# Patient Record
Sex: Female | Born: 1950 | Race: White | Hispanic: No | Marital: Married | Smoking: Former smoker
Health system: Southern US, Community
[De-identification: ages and names within clinical notes are randomized; demographics above are authoritative.]

## PROBLEM LIST (undated history)

## (undated) DIAGNOSIS — C801 Malignant (primary) neoplasm, unspecified: Secondary | ICD-10-CM

## (undated) HISTORY — PX: APPENDECTOMY: SHX54

## (undated) HISTORY — PX: BRAIN SURGERY: SHX531

## (undated) HISTORY — PX: KNEE ARTHROCENTESIS: SUR44

---

## 2017-01-24 LAB — IFOBT (OCCULT BLOOD): IFOBT: NEGATIVE

## 2017-08-07 ENCOUNTER — Emergency Department (HOSPITAL_COMMUNITY): Payer: 59

## 2017-08-07 ENCOUNTER — Encounter (HOSPITAL_COMMUNITY): Payer: Self-pay | Admitting: Emergency Medicine

## 2017-08-07 ENCOUNTER — Other Ambulatory Visit: Payer: Self-pay

## 2017-08-07 ENCOUNTER — Emergency Department (HOSPITAL_COMMUNITY)
Admission: EM | Admit: 2017-08-07 | Discharge: 2017-08-07 | Disposition: A | Discharge status: Home / Self Care | Payer: 59 | Attending: Emergency Medicine | Admitting: Emergency Medicine

## 2017-08-07 DIAGNOSIS — Z041 Encounter for examination and observation following transport accident: Secondary | ICD-10-CM | POA: Diagnosis present

## 2017-08-07 DIAGNOSIS — R0789 Other chest pain: Secondary | ICD-10-CM | POA: Diagnosis not present

## 2017-08-07 DIAGNOSIS — Y999 Unspecified external cause status: Secondary | ICD-10-CM | POA: Insufficient documentation

## 2017-08-07 DIAGNOSIS — Y929 Unspecified place or not applicable: Secondary | ICD-10-CM | POA: Insufficient documentation

## 2017-08-07 DIAGNOSIS — Y939 Activity, unspecified: Secondary | ICD-10-CM | POA: Insufficient documentation

## 2017-08-07 DIAGNOSIS — Z87891 Personal history of nicotine dependence: Secondary | ICD-10-CM | POA: Insufficient documentation

## 2017-08-07 HISTORY — DX: Malignant (primary) neoplasm, unspecified: C80.1
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
## 2017-08-07 MED ORDER — NAPROXEN 500 MG PO TABS
500.0000 mg | ORAL_TABLET | Freq: Once | ORAL | Status: AC
  Administered 2017-08-07: 500 mg via ORAL
  Filled 2017-08-07: qty 1

## 2017-08-07 MED ORDER — NAPROXEN 500 MG PO TABS: 500.0000 mg | ORAL_TABLET | Freq: Two times a day (BID) | ORAL | 0 refills | Status: DC

## 2017-08-07 MED ORDER — METHOCARBAMOL 500 MG PO TABS: 500.0000 mg | ORAL_TABLET | Freq: Two times a day (BID) | ORAL | 0 refills | Status: DC
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
## 2017-08-12 ENCOUNTER — Ambulatory Visit
Admission: RE | Admit: 2017-08-12 | Discharge: 2017-08-12 | Disposition: A | Discharge status: Home / Self Care | Payer: 59 | Source: Ambulatory Visit | Attending: Family Medicine | Admitting: Family Medicine

## 2017-08-12 ENCOUNTER — Other Ambulatory Visit: Payer: Self-pay | Admitting: Family Medicine

## 2017-08-12 DIAGNOSIS — R109 Unspecified abdominal pain: Secondary | ICD-10-CM
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
## 2017-08-12 MED ORDER — IOPAMIDOL (ISOVUE-300) INJECTION 61%
100.0000 mL | Freq: Once | INTRAVENOUS | Status: AC | PRN
  Administered 2017-08-12: 100 mL via INTRAVENOUS

## 2018-02-06 LAB — IFOBT (OCCULT BLOOD): IFOBT: NEGATIVE

## 2018-05-04 ENCOUNTER — Encounter: Payer: Self-pay | Admitting: Family Medicine

## 2018-05-04 ENCOUNTER — Ambulatory Visit (INDEPENDENT_AMBULATORY_CARE_PROVIDER_SITE_OTHER): Payer: 59 | Admitting: Family Medicine

## 2018-05-04 DIAGNOSIS — M159 Polyosteoarthritis, unspecified: Secondary | ICD-10-CM | POA: Insufficient documentation

## 2018-05-04 NOTE — Progress Notes (Signed)
HPI:   Lindsey Kemp is a 67 y.o. female, who is here today to establish care.  Former PCP: Dr Osborne Casco. Last preventive routine visit: 11/2017  Chronic medical problems: OA  She exercises regularly and at least 6 days per week. Follows a "extremitly" healthful diet.  Concerns today: She mentions arthralgias and hands numbness but states that she does not want to address these problems because they are chronic and she is not interested in further work up. States that she had EMG of lower extremities and it was very uncomfortable.  Review of Systems  Musculoskeletal: Positive for arthralgias and joint swelling. Negative for gait problem and neck pain.  Neurological: Positive for numbness. Negative for weakness.    No current outpatient medications on file prior to visit.   No current facility-administered medications on file prior to visit.      Past Medical History:  Diagnosis Date  . Cancer ([facility])    Allergies  Allergen Reactions  . Codeine Nausea And Vomiting    Family History  Problem Relation Age of Onset  . Cancer Mother     Social History   Socioeconomic History  . Marital status: Married    Spouse name: Not on file  . Number of children: Not on file  . Years of education: Not on file  . Highest education level: Not on file  Occupational History  . Not on file  Social Needs  . Financial resource strain: Not on file  . Food insecurity:    Worry: Not on file    Inability: Not on file  . Transportation needs:    Medical: Not on file    Non-medical: Not on file  Tobacco Use  . Smoking status: Former Smoker    Types: Cigarettes  Substance and Sexual Activity  . Alcohol use: Yes    Comment: weekly  . Drug use: No  . Sexual activity: Yes  Lifestyle  . Physical activity:    Days per week: Not on file    Minutes per session: Not on file  . Stress: Not on file  Relationships  . Social connections:    Talks on phone: Not on file    Gets  together: Not on file    Attends religious service: Not on file    Active member of club or organization: Not on file    Attends meetings of clubs or organizations: Not on file    Relationship status: Not on file  Other Topics Concern  . Not on file  Social History Narrative  . Not on file    Vitals:   05/04/18 1414  BP: 110/80  Pulse: 75  Resp: 12  Temp: 98.4 F (36.9 C)  SpO2: 98%    Body mass index is 22.64 kg/m.   Physical Exam  Nursing note and vitals reviewed. Constitutional: She appears well-developed and well-nourished.  HENT:  Head: Normocephalic and atraumatic.  Respiratory: No respiratory distress.  Musculoskeletal:     Comments: Some mild IP deformities noted on inspection. Heberden's node  and Bouchard's nodes. I do not appreciate signs of synovitis.   Psychiatric: Her affect is blunt.  Well groomed,good eye contact.     ASSESSMENT AND PLAN:   Lindsey Kemp was seen today for establish care.  Diagnoses and all orders for this visit:  Generalized osteoarthritis of hand  Educated about Dx and prognosis.   Upset because she was not expecting to have an examination, she is not willing to  pay for the visit because she has had a high deductible. According to patient, she was told on the phone when she arranged appointment that this visit will be a "meetig and greeting."   So no charge for today's visit. She will arrange appt if she wants to address concerns,otherwise she can follow in 11/2018 for her routine CPE.      [name]. [name], MD  [facility]. [place] office.

## 2018-05-05 ENCOUNTER — Encounter: Payer: Self-pay | Admitting: Family Medicine

## 2018-05-06 ENCOUNTER — Other Ambulatory Visit: Payer: Self-pay | Admitting: Obstetrics

## 2018-05-06 DIAGNOSIS — M810 Age-related osteoporosis without current pathological fracture: Secondary | ICD-10-CM

## 2018-05-06 DIAGNOSIS — Z853 Personal history of malignant neoplasm of breast: Secondary | ICD-10-CM

## 2018-05-07 ENCOUNTER — Encounter: Payer: Self-pay | Admitting: Family Medicine

## 2018-05-22 ENCOUNTER — Telehealth: Payer: Self-pay | Admitting: Family Medicine

## 2018-05-22 NOTE — Telephone Encounter (Signed)
Physician Results form to be filled out, placed in dr's folder.  Fax to [phone] upon completion.

## 2018-05-28 ENCOUNTER — Encounter: Payer: Self-pay | Admitting: *Deleted

## 2018-05-28 NOTE — Telephone Encounter (Signed)
Pt came by and signed the form and it was put in the providers folder for completion by the provider and faxed.

## 2018-05-28 NOTE — Telephone Encounter (Signed)
Form placed on Dr. Doug Sou desk for signature.

## 2018-05-28 NOTE — Telephone Encounter (Signed)
Left detailed message for patient to schedule appointment. Patient does not have any labs in chart to have form filled out.

## 2018-05-28 NOTE — Telephone Encounter (Signed)
Spoke with patient and informed her that her records were on PCP's desk and that she needed to sign form. Patient informed that PCP is out of the office until Monday and that once she signs it, form will be faxed. Patient verbalized understanding and stated that she would come in on 05/28/18 to sign her part of the form. Form placed at front desk for patient to sign.

## 2018-05-28 NOTE — Telephone Encounter (Signed)
Pt called and stated that she recently had labs done at another office. Pt states that the office sent over all records and we should have the recent labs. Please advise [phone]

## 2018-05-29 NOTE — Telephone Encounter (Signed)
Form placed on provider's desk for signature.  

## 2018-06-01 NOTE — Telephone Encounter (Signed)
Form faxed as requested with confirmation.

## 2018-06-11 ENCOUNTER — Ambulatory Visit
Admission: RE | Admit: 2018-06-11 | Discharge: 2018-06-11 | Disposition: A | Discharge status: Home / Self Care | Payer: 59 | Source: Ambulatory Visit | Attending: Obstetrics | Admitting: Obstetrics

## 2018-06-11 DIAGNOSIS — Z853 Personal history of malignant neoplasm of breast: Secondary | ICD-10-CM

## 2018-06-11 DIAGNOSIS — M810 Age-related osteoporosis without current pathological fracture: Secondary | ICD-10-CM

## 2018-11-05 ENCOUNTER — Encounter: Payer: Self-pay | Admitting: Family Medicine

## 2018-11-09 ENCOUNTER — Encounter: Payer: Self-pay | Admitting: Family Medicine

## 2018-11-09 ENCOUNTER — Ambulatory Visit (INDEPENDENT_AMBULATORY_CARE_PROVIDER_SITE_OTHER): Payer: 59 | Admitting: Family Medicine

## 2018-11-09 ENCOUNTER — Other Ambulatory Visit: Payer: Self-pay

## 2018-11-09 DIAGNOSIS — M2550 Pain in unspecified joint: Secondary | ICD-10-CM

## 2018-11-09 DIAGNOSIS — R200 Anesthesia of skin: Secondary | ICD-10-CM | POA: Diagnosis not present

## 2018-11-09 DIAGNOSIS — I7300 Raynaud's syndrome without gangrene: Secondary | ICD-10-CM

## 2018-11-09 NOTE — Progress Notes (Signed)
Virtual Visit via Video Note   I connected with Lindsey Ocampo on 11/09/18 at  4:00 PM EDT by a video enabled telemedicine application and verified that I am speaking with the correct person using two identifiers.  Location patient: home Location provider:work office Persons participating in the virtual visit: patient, provider  I discussed the limitations of evaluation and management by telemedicine and the availability of in person appointments. The patient expressed understanding and agreed to proceed.   HPI: Lindsey Kemp is a 68 yo female with Hx of joint pain, who is requesting referral to rheumatologist. She tried to arranged appt with Dr Estanislado Pandy but was told she needed a referral.  She has long Hx of arthralgias, concerned because some symptoms are getting worse. States that her fingers have been "deformed" since she was a teenager. Throbbing ,severe fingers and toes pain.  Having IP joint edema,occasionally she also see erythema. Morning stiffness that lasts > 1 hours. She does not have daily symptoms.  Pain and stiffness are alleviated by exercise. Finger numbness and sometiems fingers turn white + Cold sensation,denies cyanosis.These symptoms seem to be worse with cold weather but also noted when she is warm in bed.  Numbness is worse in the morning, alleviated by shaking hands. Rings do not fit in the morning,edema improves as the day goes.   She has had EMG of LE's,refused upper extremities studies.  Hx of carpal tunnel synd,states that symptoms are not like what she had before. Negative for weakness. Hx of neck pain and stiffness.  She has followed with chiropractor for right hip pain,states that she was told she had "nerve pressure" between L1-2.  ROS: See pertinent positives and negatives per HPI.  Past Medical History:  Diagnosis Date  . Cancer [facility])     Past Surgical History:  Procedure Laterality Date  . APPENDECTOMY    . BRAIN SURGERY    . CESAREAN  SECTION    . KNEE ARTHROCENTESIS     left    Family History  Problem Relation Age of Onset  . Cancer Mother     Social History   Socioeconomic History  . Marital status: Married    Spouse name: Not on file  . Number of children: Not on file  . Years of education: Not on file  . Highest education level: Not on file  Occupational History  . Not on file  Social Needs  . Financial resource strain: Not on file  . Food insecurity    Worry: Not on file    Inability: Not on file  . Transportation needs    Medical: Not on file    Non-medical: Not on file  Tobacco Use  . Smoking status: Former Smoker    Types: Cigarettes  . Smokeless tobacco: Never Used  Substance and Sexual Activity  . Alcohol use: Yes    Comment: weekly  . Drug use: No  . Sexual activity: Yes  Lifestyle  . Physical activity    Days per week: Not on file    Minutes per session: Not on file  . Stress: Not on file  Relationships  . Social Herbalist on phone: Not on file    Gets together: Not on file    Attends religious service: Not on file    Active member of club or organization: Not on file    Attends meetings of clubs or organizations: Not on file    Relationship status: Not on file  . Intimate  partner violence    Fear of current or ex partner: Not on file    Emotionally abused: Not on file    Physically abused: Not on file    Forced sexual activity: Not on file  Other Topics Concern  . Not on file  Social History Narrative  . Not on file     No current outpatient medications on file.  EXAM:  VITALS per patient if applicable:N/A  GENERAL: alert, oriented, appears well and in no acute distress  HEENT: atraumatic, conjunttiva clear, no obvious abnormalities on inspection of external nose and ears  NECK: normal movements of the head and neck  LUNGS: on inspection no signs of respiratory distress, breathing rate appears normal, no obvious gross SOB, gasping or wheezing  CV:  no obvious cyanosis  Lindsey: moves all visible extremities without noticeable edema or erythema.IP hands mild deformities.  PSYCH/NEURO: pleasant and cooperative, no obvious depression,she is anxious, speech and thought processing grossly intact  ASSESSMENT AND PLAN:  Discussed the following assessment and plan:  Orders Placed This Encounter  Procedures  . CBC  . Cyclic citrul peptide antibody, IgG  . C-reactive protein  . Rheumatoid factor  . Sedimentation rate  . ANA  . Ambulatory referral to Rheumatology   Polyarthralgia Possible etiologies discussed. She sent toes picture, 2nd left toe with distal phalange edema and 3rd toe with IP nodule and mild deformity. ? OA,RA. Rheumatology referral placed. Further recommendations will be given according to lab results.   Bilateral hand numbness ? Carpal tunnel synd, raynaud related,radicukar among among some to consider. She is not interested in EMG. Instructed about warning signs.   Raynaud's phenomenon without gangrene  Some of reported symptom suggest Raynaud like symptom. Instructed about warning signs. Follow with rheumatologist.   I discussed the assessment and treatment plan with the patient. The patient was provided an opportunity to ask questions and all were answered. The patient agreed with the plan and demonstrated an understanding of the instructions.    Return if symptoms worsen or fail to improve.    Arneisha Kincannon [name], MD

## 2018-11-09 NOTE — Assessment & Plan Note (Addendum)
Possible etiologies discussed. She sent toes picture, 2nd left toe with distal phalange edema and 3rd toe with IP nodule and mild deformity. ? OA,RA. Rheumatology referral placed. Further recommendations will be given according to lab results.

## 2018-11-09 NOTE — Assessment & Plan Note (Signed)
?   Carpal tunnel synd, raynaud related,radicukar among among some to consider. She is not interested in EMG. Instructed about warning signs.

## 2018-11-10 ENCOUNTER — Other Ambulatory Visit (INDEPENDENT_AMBULATORY_CARE_PROVIDER_SITE_OTHER): Payer: 59

## 2018-11-10 ENCOUNTER — Other Ambulatory Visit: Payer: Self-pay

## 2018-11-10 DIAGNOSIS — I7300 Raynaud's syndrome without gangrene: Secondary | ICD-10-CM | POA: Diagnosis not present

## 2018-11-10 DIAGNOSIS — M2550 Pain in unspecified joint: Secondary | ICD-10-CM

## 2018-11-10 LAB — CBC
HCT: 38.7 % (ref 36.0–46.0)
Hemoglobin: 12.8 g/dL (ref 12.0–15.0)
MCHC: 33 g/dL (ref 30.0–36.0)
MCV: 89.4 fl (ref 78.0–100.0)
Platelets: 263 K/uL (ref 150.0–400.0)
RBC: 4.33 Mil/uL (ref 3.87–5.11)
RDW: 13.9 % (ref 11.5–15.5)
WBC: 5.3 K/uL (ref 4.0–10.5)

## 2018-11-10 LAB — SEDIMENTATION RATE: Sed Rate: 14 mm/h (ref 0–30)

## 2018-11-10 LAB — C-REACTIVE PROTEIN: CRP: <1 mg/dL (ref 0.5–20.0)

## 2018-11-12 LAB — CYCLIC CITRUL PEPTIDE ANTIBODY, IGG: Cyclic Citrullin Peptide Ab: <16 UNITS

## 2018-11-12 LAB — ANA: Anti Nuclear Antibody (ANA): NEGATIVE

## 2018-11-12 LAB — RHEUMATOID FACTOR: Rhuematoid fact SerPl-aCnc: <14 IU/mL (ref <14)

## 2018-11-16 ENCOUNTER — Encounter: Payer: Self-pay | Admitting: Family Medicine

## 2018-11-17 ENCOUNTER — Telehealth: Payer: Self-pay | Admitting: *Deleted

## 2018-11-17 NOTE — Telephone Encounter (Signed)
Patient was given results and referral was placed for Rheumatology.  Copied from [place] [id number]. Topic: General - Other >> Nov 13, 2018 11:16 AM Jodie Echevaria wrote: Reason for CRM: Patient called to inquire about the lab results and referral for a Rheumatologist asking for a call back ASAP please with some answers. Ph# [phone]

## 2018-11-18 ENCOUNTER — Telehealth: Payer: Self-pay | Admitting: *Deleted

## 2018-11-18 NOTE — Telephone Encounter (Signed)
Patient given results by Dr. [name] on 11/16/2018. Patient informed that Rheumatology referral was placed and given information for the specialist office.  Copied from [place] [phone]. Topic: General - Other >> Nov 13, 2018 11:16 AM Jodie Echevaria wrote: Reason for CRM: Patient called to inquire about the lab results and referral for a Rheumatologist asking for a call back ASAP please with some answers. Ph# [phone]

## 2018-12-21 ENCOUNTER — Encounter: Payer: Self-pay | Admitting: Family Medicine

## 2019-01-29 ENCOUNTER — Encounter: Payer: 59 | Admitting: Family Medicine

## 2019-02-10 ENCOUNTER — Other Ambulatory Visit: Payer: Self-pay

## 2019-02-10 ENCOUNTER — Ambulatory Visit (INDEPENDENT_AMBULATORY_CARE_PROVIDER_SITE_OTHER): Payer: 59 | Admitting: Family Medicine

## 2019-02-10 ENCOUNTER — Encounter: Payer: Self-pay | Admitting: Family Medicine

## 2019-02-10 VITALS — BP 120/72 | HR 68 | Temp 97.5°F | Resp 12 | Ht 64.8 in | Wt 129.1 lb

## 2019-02-10 DIAGNOSIS — Z1329 Encounter for screening for other suspected endocrine disorder: Secondary | ICD-10-CM | POA: Diagnosis not present

## 2019-02-10 DIAGNOSIS — Z23 Encounter for immunization: Secondary | ICD-10-CM

## 2019-02-10 DIAGNOSIS — Z13228 Encounter for screening for other metabolic disorders: Secondary | ICD-10-CM

## 2019-02-10 DIAGNOSIS — Z0000 Encounter for general adult medical examination without abnormal findings: Secondary | ICD-10-CM

## 2019-02-10 DIAGNOSIS — Z130 Encounter for screening for diseases of the blood and blood-forming organs and certain disorders involving the immune mechanism: Secondary | ICD-10-CM

## 2019-02-10 DIAGNOSIS — L03116 Cellulitis of left lower limb: Secondary | ICD-10-CM | POA: Diagnosis not present

## 2019-02-10 DIAGNOSIS — E785 Hyperlipidemia, unspecified: Secondary | ICD-10-CM

## 2019-02-10 LAB — LIPID PANEL
Cholesterol: 225 mg/dL — ABNORMAL HIGH (ref 0–200)
HDL: 82.3 mg/dL (ref >39.00)
LDL Cholesterol: 127 mg/dL — ABNORMAL HIGH (ref 0–99)
NonHDL: 142.8
Total CHOL/HDL Ratio: 3
Triglycerides: 79 mg/dL (ref 0.0–149.0)
VLDL: 15.8 mg/dL (ref 0.0–40.0)

## 2019-02-10 LAB — BASIC METABOLIC PANEL
BUN: 18 mg/dL (ref 6–23)
CO2: 34 mEq/L — ABNORMAL HIGH (ref 19–32)
Calcium: 10.4 mg/dL (ref 8.4–10.5)
Chloride: 97 mEq/L (ref 96–112)
Creatinine, Ser: 0.66 mg/dL (ref 0.40–1.20)
GFR: 89.15 mL/min (ref >60.00)
Glucose, Bld: 89 mg/dL (ref 70–99)
Potassium: 4.6 mEq/L (ref 3.5–5.1)
Sodium: 139 mEq/L (ref 135–145)

## 2019-02-10 LAB — HEMOGLOBIN A1C: Hgb A1c MFr Bld: 5.8 % (ref 4.6–6.5)

## 2019-02-10 MED ORDER — CEPHALEXIN 500 MG PO CAPS
500.0000 mg | ORAL_CAPSULE | Freq: Two times a day (BID) | ORAL | 0 refills | Status: AC
Start: 2019-02-10 — End: 2019-02-17

## 2019-02-10 NOTE — Patient Instructions (Signed)
Today you have you routine preventive visit.  At least 150 minutes of moderate exercise per week, daily brisk walking for 15-30 min is a good exercise option. Healthy diet low in saturated (animal) fats and sweets and consisting of fresh fruits and vegetables, lean meats such as fish and white chicken and whole grains.  These are some of recommendations for screening depending of age and risk factors:   - Vaccines:  Tdap vaccine every 10 years.  Shingles vaccine recommended at age 44, could be given after 67 years of age but not sure about insurance coverage.   Pneumonia vaccines:  Pneumovax at 85 .   Screening for diabetes at age 70 and every 3 years.  -Breast cancer: Mammogram: There is disagreement between experts about when to start screening in low risk asymptomatic female but recent recommendations are to start screening at 16 and not later than 67 years old , every 1-2 years and after 67 yo q 2 years. Screening is recommended until 67 years old but some women can continue screening depending of healthy issues.   Colon cancer screening: starts at 67 years old until 67 years old.  Also recommended:  1. Dental visit- Brush and floss your teeth twice daily; visit your dentist twice a year. 2. Eye doctor- Get an eye exam at least every 2 years. 3. Helmet use- Always wear a helmet when riding a bicycle, motorcycle, rollerblading or skateboarding. 4. Safe sex- If you may be exposed to sexually transmitted infections, use a condom. 5. Seat belts- Seat belts can save your live; always wear one. 6. Smoke/Carbon Monoxide detectors- These detectors need to be installed on the appropriate level of your home. Replace batteries at least once a year. 7. Skin cancer- When out in the sun please cover up and use sunscreen 15 SPF or higher. 8. Violence- If anyone is threatening or hurting you, please tell your healthcare provider.  9. Drink alcohol in moderation- Limit alcohol intake to one  drink or less per day. Never drink and drive.

## 2019-02-10 NOTE — Progress Notes (Signed)
HPI:   [name] is a 68 y.o. female, who is here today for her routine physical.  Last CPE: 11/2017.  Regular exercise 3 or more time per week: At least 5-6 times per week. She walks 3 miles in the morning,lifts wts,and 3 times per week dances. Following a healthful diet: "Very" healthy She lives with her husband.  Chronic medical problems: Polyarthralgia,breast cancer in 2011. Genetic testing showed that she has high risk for breast and colon cancer. She follows with gyn annually,next appt 03/2019.   There is no immunization history on file for this patient.  Mammogram: 03/2018. Colonoscopy: 3/20202, according to pt,becasue poor prep,she is supposed to have colonoscopy repeated in 05/2019. DEXA: She thinks it was ordered by her gyn. She is following with rheumatologist for polyarthralgia.  Hep C screening: She is not sure but she is not interested.  Concerns today:  She needs form completed for insurance purposes.  HLD, she is on non pharmacologic treatment. 01/27/18 TG 59,TC 223, LDL 133,HDL 75.  Former [place] in 2005.  LLE with edematous,erythematous,and tender pretibial area. She was outdoors about 10-14 days ago when she felt a sting, very painful. Initially sore she has been monitoring but for the past few days it has been getting worse.  She has applied OTC neosporin. Negative for fever, distal cyanosis,numbness,or tingling.  Review of Systems  Constitutional: Negative for appetite change, fatigue and fever.  HENT: Negative for dental problem, hearing loss, mouth sores, sore throat, trouble swallowing and voice change.   Eyes: Negative for redness and visual disturbance.  Respiratory: Negative for cough, shortness of breath and wheezing.   Cardiovascular: Negative for chest pain and leg swelling.  Gastrointestinal: Negative for abdominal pain, nausea and vomiting.       No changes in bowel habits.  Endocrine: Negative for cold  intolerance, heat intolerance, polydipsia, polyphagia and polyuria.  Genitourinary: Negative for decreased urine volume, dysuria, hematuria, vaginal bleeding and vaginal discharge.  Musculoskeletal: Positive for arthralgias, negative for gait problem and myalgias.  Skin: Negative for pallor and rash.  Allergic/Immunologic: Negative for environmental allergies.  Neurological: Negative for syncope, weakness and headaches.  Hematological: Negative for adenopathy. Does not bruise/bleed easily.  Psychiatric/Behavioral: Negative for confusion and sleep disturbance. The patient is nervous/anxious.   All other systems reviewed and are negative.   No current outpatient medications on file prior to visit.   No current facility-administered medications on file prior to visit.     Past Medical History:  Diagnosis Date  . Cancer [facility]     Past Surgical History:  Procedure Laterality Date  . APPENDECTOMY    . BRAIN SURGERY    . CESAREAN SECTION    . KNEE ARTHROCENTESIS     left    Allergies  Allergen Reactions  . Codeine Nausea And Vomiting    Family History  Problem Relation Age of Onset  . Cancer Mother     Social History   Socioeconomic History  . Marital status: Married    Spouse name: Not on file  . Number of children: Not on file  . Years of education: Not on file  . Highest education level: Not on file  Occupational History  . Not on file  Social Needs  . Financial resource strain: Not on file  . Food insecurity    Worry: Not on file    Inability: Not on file  . Transportation needs    Medical: Not on file  Non-medical: Not on file  Tobacco Use  . Smoking status: Former Smoker    Types: Cigarettes  . Smokeless tobacco: Never Used  Substance and Sexual Activity  . Alcohol use: Yes    Comment: weekly  . Drug use: No  . Sexual activity: Yes  Lifestyle  . Physical activity    Days per week: Not on file    Minutes per session: Not on file  . Stress: Not on  file  Relationships  . Social Herbalist on phone: Not on file    Gets together: Not on file    Attends religious service: Not on file    Active member of club or organization: Not on file    Attends meetings of clubs or organizations: Not on file    Relationship status: Not on file  Other Topics Concern  . Not on file  Social History Narrative  . Not on file     Vitals:   02/10/19 0820  BP: 120/72  Pulse: 68  Resp: 12  Temp: (!) 97.5 F (36.4 C)  SpO2: 95%   Body mass index is 21.62 kg/m.   Wt Readings from Last 3 Encounters:  02/10/19 129 lb 2 oz (58.6 kg)  05/04/18 135 lb 3.2 oz (61.3 kg)  08/07/17 130 lb (59 kg)     Physical Exam  Nursing note and vitals reviewed. Constitutional: She is oriented to person, place, and time. She appears well-developed and well-nourished. No distress.  HENT:  Head: Normocephalic and atraumatic.  Right Ear: Hearing, tympanic membrane, external ear and ear canal normal.  Left Ear: Hearing, tympanic membrane, external ear and ear canal normal.  Mouth/Throat: Uvula is midline, oropharynx is clear and moist and mucous membranes are normal.  Eyes: Pupils are equal, round, and reactive to light. Conjunctivae and EOM are normal.  Neck: No tracheal deviation present. No thyromegaly present.  Cardiovascular: Normal rate and regular rhythm.  No murmur heard. Pulses:      Dorsalis pedis pulses are 2+ on the right side, and 2+ on the left side.  Respiratory: Effort normal and breath sounds normal. No respiratory distress.  GI: Soft. She exhibits no mass. There is no hepatomegaly. There is no tenderness.  Genitourinary:Comments: Deferred to gyn.  Musculoskeletal: She exhibits no edema or signs of synovitis. Lymphadenopathy:    She has no cervical adenopathy.       Right: No supraclavicular adenopathy present.       Left: No supraclavicular adenopathy present.  Neurological: She is alert and oriented to person, place, and time.  She has normal strength. No cranial nerve deficit. Coordination and gait normal.  Reflex Scores:      Bicep reflexes are 2+ on the right side and 2+ on the left side.      Patellar reflexes are 2+ on the right side and 2+ on the left side. Skin: Skin is warm. No rash noted. Pretibial area left LE with ecchymosis surrounded by erythema ,mild edema and local heat. Psychiatric: She has a normal mood and affect. Cognitive function grossly intact. Well groomed, good eye contact.    ASSESSMENT AND PLAN:  Ms. Dempsey Feider was here today annual physical examination.   Orders Placed This Encounter  Procedures  . Hemoglobin A1c  . Lipid panel  . Basic metabolic panel   Lab Results  Component Value Date   HGBA1C 5.8 02/10/2019    Lab Results  Component Value Date   CREATININE 0.66 02/10/2019  BUN 18 02/10/2019   NA 139 02/10/2019   K 4.6 02/10/2019   CL 97 02/10/2019   CO2 34 (H) 02/10/2019   Lab Results  Component Value Date   CHOL 225 (H) 02/10/2019   HDL 82.30 02/10/2019   LDLCALC 127 (H) 02/10/2019   TRIG 79.0 02/10/2019   CHOLHDL 3 02/10/2019     Routine general medical examination at a health care facility We discussed the importance of regular physical activity and healthy diet for prevention of chronic illness and/or complications. Preventive guidelines reviewed. Vaccination: She received a pneumonia vaccine last year,not sure which one,prefers to hold on this until she finds out which vaccine she received.  She will continue following with gynecologist for her female preventive care. Pending repeating colonoscopy.  She refused hepatitis C screening. Ca++ and vit D supplementation to continue. Next CPE in a year.  The 10-year ASCVD risk score Mikey Bussing DC Brooke Bonito., et al., 2013) is: 5.6%   Values used to calculate the score:     Age: 9 years     Sex: Female     Is Non-Hispanic African American: No     Diabetic: No     Tobacco smoker: No     Systolic Blood  Pressure: 120 mmHg     Is BP treated: No     HDL Cholesterol: 82.3 mg/dL     Total Cholesterol: 225 mg/dL  Hyperlipidemia, unspecified hyperlipidemia type Mild. Continue low fat diet. Further recommendations will be given according to lab result and 10 years CVD risk.  -     Lipid panel  Screening for endocrine, metabolic and immunity disorder -     Hemoglobin A1c -     Basic metabolic panel  Cellulitis of left lower extremity Keep area clear with soap and water. LE elevation a few times during the day. Instructed about warning sign. Some side effects of abx discussed.  -     cephALEXin (KEFLEX) 500 MG capsule; Take 1 capsule (500 mg total) by mouth 2 (two) times daily for 7 days.   Return in 1 year (on 02/10/2020) for cpe.     G. [name], MD  [facility]. [place] office.

## 2019-02-13 ENCOUNTER — Encounter: Payer: Self-pay | Admitting: Family Medicine

## 2019-02-14 ENCOUNTER — Encounter: Payer: Self-pay | Admitting: Family Medicine

## 2019-04-16 ENCOUNTER — Telehealth: Payer: Self-pay

## 2019-04-16 NOTE — Telephone Encounter (Signed)
Spoke with patient and advised on DOS 02/10/19 she came in for a CPE and also discussed an area on her LLE that was red and swollen from a bug bite. Dr. [name] gave her a rx for an antibiotic for treatment if the area did not improve. Due to this acute concern and documentation a separate code is required to be billed for an office visit, in addition to the CPE. Patient voiced understanding. Nothing further needed at this time.

## 2019-04-16 NOTE — Telephone Encounter (Signed)
Copied from [place] [id number]. Topic: General - Other >> Apr 15, 2019  2:55 PM Keene Breath wrote: Reason for CRM: Patient called to ask the nurse to call her in reference to a 02/10/19 visit and coding issues.  Please call to discuss at [phone]

## 2019-05-12 ENCOUNTER — Telehealth: Payer: Self-pay | Admitting: Hematology and Oncology

## 2019-05-12 NOTE — Telephone Encounter (Signed)
Ms. Rodenbeck returned my call and has been scheduled to see Dr. Lindi Adie on 1/20 at 1pm. She's been made aware to arrive 15 minutes early.

## 2019-05-18 ENCOUNTER — Ambulatory Visit: Payer: 59 | Attending: Internal Medicine

## 2019-05-18 DIAGNOSIS — U071 COVID-19: Secondary | ICD-10-CM

## 2019-05-20 LAB — NOVEL CORONAVIRUS, NAA: SARS-CoV-2, NAA: NOT DETECTED

## 2019-06-15 NOTE — Progress Notes (Addendum)
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 female is here because of recent diagnosis of high risk for breast cancer. Genetic testing showed she was positive for the CHEK2 mutation. She presents to the clinic today for initial evaluation and discussion of surveillance options.   Patient was diagnosed with breast cancer in 2011 at [place].  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  She refused antiestrogen therapy and has been religiously watching with annual mammograms at [place].  She moved to [place] area 2-1/2 years ago after her husband's employment.  She was following up with Dr. Valentino Saxon who performed genetic testing in 2019.  The results came back as CHEK2 mutation and FH mutation both were heterozygous.  She was referred to [place] to discuss the role of breast MRIs for surveillance as well as evaluation for other cancers.  She is a extremely active lady who exercises frequently and stays very active.  She watches her diet and takes care of her health and diligently.  She plans to move to [place] in 2023 after her husband retires.  I reviewed her records extensively and collaborated the history with the patient.  MEDICAL HISTORY:  Past Medical History:  Diagnosis Date  . Cancer [facility])    Breast (2011)    SURGICAL HISTORY: Past Surgical History:  Procedure Laterality Date  . APPENDECTOMY    . BREAST SURGERY    . CESAREAN SECTION    . KNEE ARTHROCENTESIS     left    SOCIAL HISTORY: Social History   Socioeconomic History  . Marital status: Married    Spouse name: Not on file  . Number of  children: Not on file  . Years of education: Not on file  . Highest education level: Not on file  Occupational History  . Not on file  Tobacco Use  . Smoking status: Former Smoker    Types: Cigarettes  . Smokeless tobacco: Never Used  Substance and Sexual Activity  . Alcohol use: Yes    Comment: weekly  . Drug use: No  . Sexual activity: Yes  Other Topics Concern  . Not on file  Social History Narrative  . Not on file   Social Determinants of Health   Financial Resource Strain:   . Difficulty of Paying Living Expenses: Not on file  Food Insecurity:   . Worried About Charity fundraiser in the Last Year: Not on file  . Ran Out of Food in the Last Year: Not on file  Transportation Needs:   . Lack of Transportation (Medical): Not on file  . Lack of Transportation (Non-Medical): Not on file  Physical Activity:   . Days of Exercise per Week: Not on file  . Minutes of Exercise per Session: Not on file  Stress:   . Feeling of Stress : Not on file  Social Connections:   . Frequency of Communication with Friends and Family: Not on file  . Frequency of Social Gatherings with Friends and Family: Not on file  . Attends Religious Services: Not on file  . Active Member of Clubs or Organizations: Not on file  . Attends Club  or Organization Meetings: Not on file  . Marital Status: Not on file  Intimate Partner Violence:   . Fear of Current or Ex-Partner: Not on file  . Emotionally Abused: Not on file  . Physically Abused: Not on file  . Sexually Abused: Not on file    FAMILY HISTORY: Family History  Problem Relation Age of Onset  . Cancer Mother     ALLERGIES:  is allergic to codeine.  MEDICATIONS:  No current outpatient medications on file.   No current facility-administered medications for this visit.    REVIEW OF SYSTEMS:   Constitutional: Denies fevers, chills or abnormal night sweats Eyes: Denies blurriness of vision, double vision or watery eyes Ears, nose,  mouth, throat, and face: Denies mucositis or sore throat Respiratory: Denies cough, dyspnea or wheezes Cardiovascular: Denies palpitation, chest discomfort or lower extremity swelling Gastrointestinal:  Denies nausea, heartburn or change in bowel habits Skin: Denies abnormal skin rashes Lymphatics: Denies new lymphadenopathy or easy bruising Neurological:Denies numbness, tingling or new weaknesses Behavioral/Psych: Mood is stable, no new changes  Breast: Denies any palpable lumps or discharge All other systems were reviewed with the patient and are negative.  PHYSICAL EXAMINATION: ECOG PERFORMANCE STATUS: 1 - Symptomatic but completely ambulatory  Vitals:   06/16/19 1257  BP: 124/61  Pulse: 91  Resp: 18  Temp: 97.8 F (36.6 C)  SpO2: 98%   Filed Weights   06/16/19 1257  Weight: 130 lb 4.8 oz (59.1 kg)    GENERAL:alert, no distress and comfortable SKIN: skin color, texture, turgor are normal, no rashes or significant lesions EYES: normal, conjunctiva are pink and non-injected, sclera clear OROPHARYNX:no exudate, no erythema and lips, buccal mucosa, and tongue normal  NECK: supple, thyroid normal size, non-tender, without nodularity LYMPH:  no palpable lymphadenopathy in the cervical, axillary or inguinal LUNGS: clear to auscultation and percussion with normal breathing effort HEART: regular rate & rhythm and no murmurs and no lower extremity edema ABDOMEN:abdomen soft, non-tender and normal bowel sounds Musculoskeletal:no cyanosis of digits and no clubbing  PSYCH: alert & oriented x 3 with fluent speech NEURO: no focal motor/sensory deficits BREAST: No palpable nodules in breast. No palpable axillary or supraclavicular lymphadenopathy (exam performed in the presence of a chaperone)   LABORATORY DATA:  I have reviewed the data as listed Lab Results  Component Value Date   WBC 5.3 11/10/2018   HGB 12.8 11/10/2018   HCT 38.7 11/10/2018   MCV 89.4 11/10/2018   PLT 263.0  11/10/2018   Lab Results  Component Value Date   NA 139 02/10/2019   K 4.6 02/10/2019   CL 97 02/10/2019   CO2 34 (H) 02/10/2019    RADIOGRAPHIC STUDIES: I have personally reviewed the radiological reports and agreed with the findings in the report.  ASSESSMENT AND PLAN:  Monoallelic mutation of CHEK2 gene in female patient Genetic testing: 05/06/2018: Pathogenic variant of CHEK2 mutation identified.  Heterozygous, 1 pathogenic variant identified in [place] CHEK 2  : I discussed with her the  genetics report. Based on NCCN guidelines, this is a pathogenic mutation that has been associated with risk of not only breast cancer but also colon,  thyroid and kidney cancers.   Pathogenesis: I discussed with the patient that CHEK-2 encodes for a serine-threonine tyrosine kinase involved in the DNA repair in combination with ATM, BRCA1, P53 genes. Patients with mutation of the CHEK-2 gene results in the damaged DNAgetting replicated and increase the patient's risk for cancers.   Surveillance: I  recommended annual mammograms and annual breast MRIs combined with monthly self breast examinations and breast examinations with her physician.   I also discussed different other options including prophylactic bilateral mastectomies. Based on NCCN guidelines, there is no data to support the role of prophylactic bilateral mastectomy for CHEK-2 mutation.   Other cancer surveillance: Apart of breast cancer, there are no definite surveillance approaches to kidney cancer. For colon cancer she will need a colonoscopy at age 64 since there is no family history and for thyroid cancer she will need manual thyroid examinations for any lumps or nodules.  Breast cancer risk: Lifetime risk of breast cancer with heterozygous CHEK2 mutation: 25 to 39% Additional cancer risks include colorectal, thyroid cancers but their risk is very low.  FH mutation: Autosomal recessive fumarate hydrotaste deficiency associated  leiomyomatosis  and renal cell cancer.  Because that she has a heterozygous mutation I do not believe there is any need to perform renal ultrasounds unless she has blood in the urine.  Breast cancer surveillance: 1.  Annual mammograms: Done in October 2020 2.  Annual breast MRIs: Will be done in June 2021.   Thyroid cancer surveillance: We will obtain thyroid ultrasound next week. All questions were answered. The patient knows to call the clinic with any problems, questions or concerns.   Rulon Eisenmenger, MD, MPH 06/16/2019    I, Molly Dorshimer, am acting as scribe for Nicholas Lose, MD.  I have reviewed the above documentation for accuracy and completeness, and I agree with the above.

## 2019-06-16 ENCOUNTER — Other Ambulatory Visit: Payer: Self-pay

## 2019-06-16 ENCOUNTER — Inpatient Hospital Stay: Payer: 59 | Attending: Hematology and Oncology | Admitting: Hematology and Oncology

## 2019-06-16 DIAGNOSIS — Z853 Personal history of malignant neoplasm of breast: Secondary | ICD-10-CM | POA: Diagnosis present

## 2019-06-16 DIAGNOSIS — Z1509 Genetic susceptibility to other malignant neoplasm: Secondary | ICD-10-CM

## 2019-06-16 DIAGNOSIS — Z1501 Genetic susceptibility to malignant neoplasm of breast: Secondary | ICD-10-CM | POA: Diagnosis present

## 2019-06-16 NOTE — Assessment & Plan Note (Signed)
Genetic testing: 05/06/2018: Pathogenic variant of CHEK2 mutation identified.  Heterozygous, 1 pathogenic variant identified in [place]  Breast cancer risk: Lifetime risk of breast cancer with heterozygous CHEK2 mutation: 25 to 39% Additional cancer risks include colorectal, thyroid cancers but their risk is very low.  FH mutation: Autosomal recessive fumarate hydrotaste deficiency associated with autosomal dominant heritage 3 leiomyomatosis and renal cell cancer  Breast cancer surveillance: 1.  Annual mammograms 2.  Annual breast MRIs 3.  Breast exams  We discussed the pros and cons of antiestrogen therapy to decrease risk of breast cancer recurrence.  After much discussion she decided not to pursue antiestrogens and that she would focus on diet and weight and other lifestyle modifications to decrease her risk.

## 2019-06-17 ENCOUNTER — Telehealth: Payer: Self-pay | Admitting: Hematology and Oncology

## 2019-06-17 NOTE — Telephone Encounter (Signed)
I talk with patient regarding schedule  

## 2019-06-18 ENCOUNTER — Encounter: Payer: Self-pay | Admitting: Hematology and Oncology

## 2019-06-21 ENCOUNTER — Other Ambulatory Visit: Payer: Self-pay | Admitting: Hematology and Oncology

## 2019-06-23 ENCOUNTER — Other Ambulatory Visit: Payer: Self-pay

## 2019-06-23 ENCOUNTER — Ambulatory Visit (HOSPITAL_COMMUNITY)
Admission: RE | Admit: 2019-06-23 | Discharge: 2019-06-23 | Disposition: A | Discharge status: Home / Self Care | Payer: 59 | Source: Ambulatory Visit | Attending: Hematology and Oncology | Admitting: Hematology and Oncology

## 2019-06-23 DIAGNOSIS — Z1509 Genetic susceptibility to other malignant neoplasm: Secondary | ICD-10-CM | POA: Insufficient documentation

## 2019-06-23 DIAGNOSIS — Z1502 Genetic susceptibility to malignant neoplasm of ovary: Secondary | ICD-10-CM | POA: Diagnosis present

## 2019-06-23 DIAGNOSIS — Z1589 Genetic susceptibility to other disease: Secondary | ICD-10-CM | POA: Diagnosis present

## 2019-06-23 DIAGNOSIS — Z1501 Genetic susceptibility to malignant neoplasm of breast: Secondary | ICD-10-CM | POA: Insufficient documentation

## 2019-06-23 IMAGING — US US THYROID
1 series · 14 of 25 positions shown · non-contrast
Comparison: None.

CLINICAL DATA: Gene mutation, rule out carcinoma

EXAM:
THYROID ULTRASOUND
TECHNIQUE: Ultrasound examination of the thyroid gland and adjacent soft
tissues was performed.

[Series 1: us thyroid · 56 acquisitions, 14 frames shown]
[im 1/56]
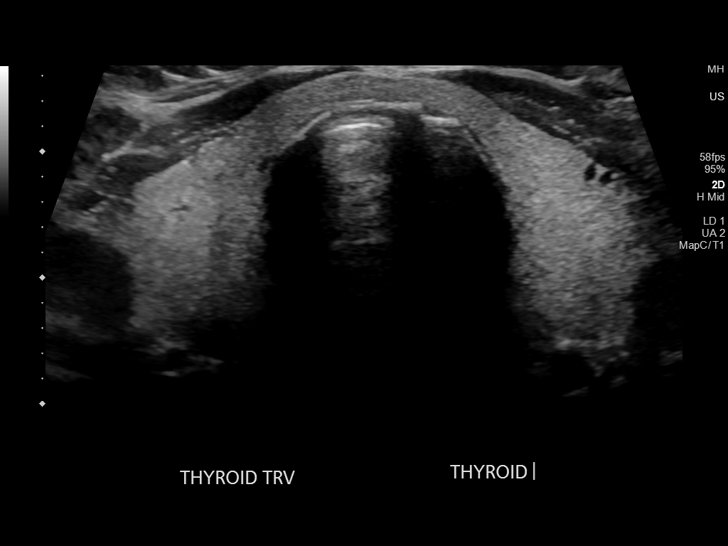
[im 5/56]
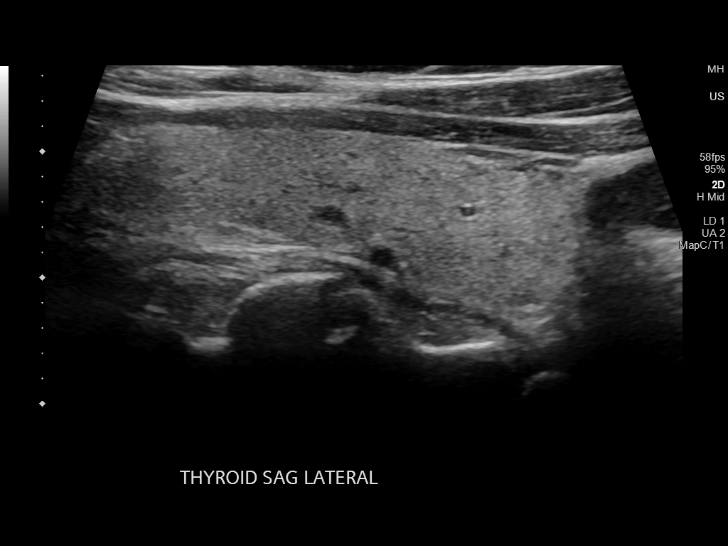
[im 10/56]
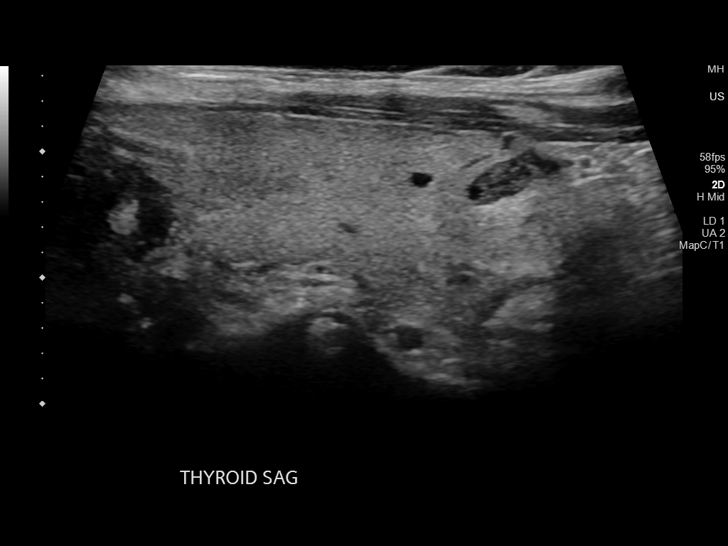
[im 14/56]
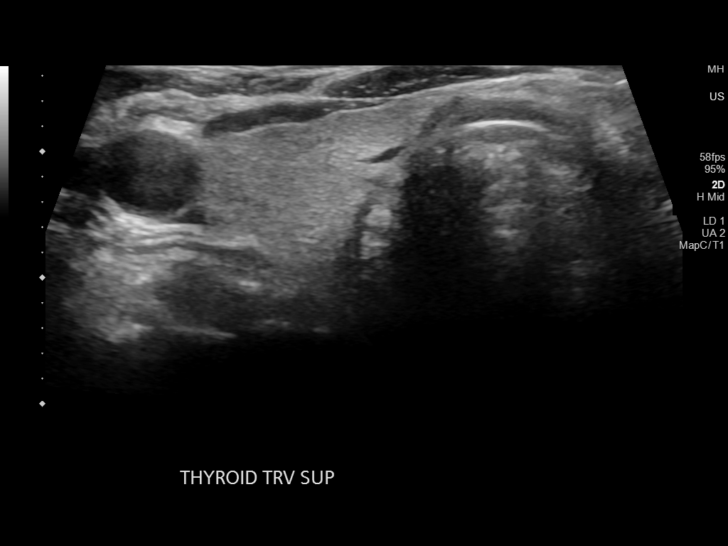
[im 19/56]
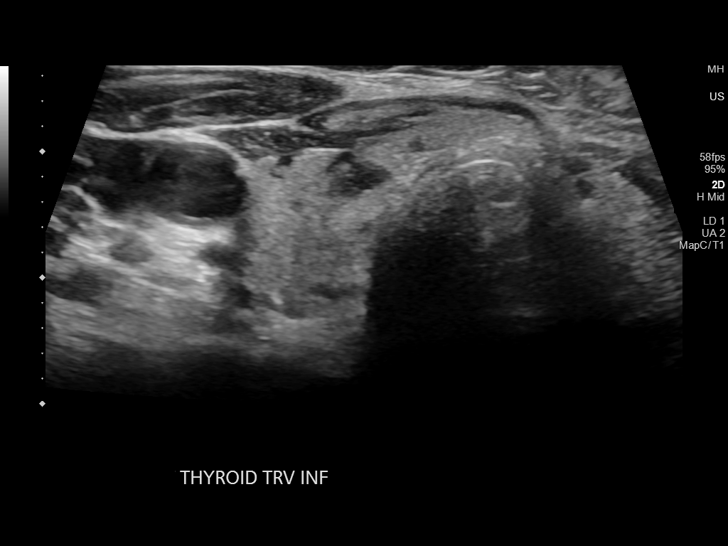
[im 21/56]
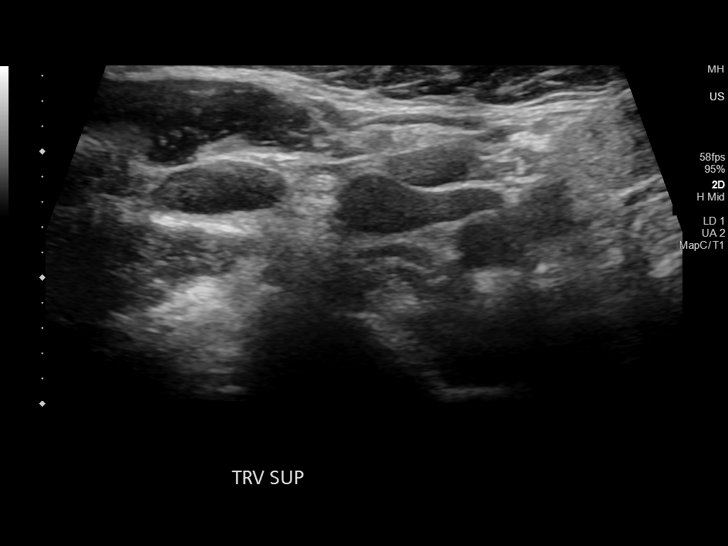
[im 26/56]
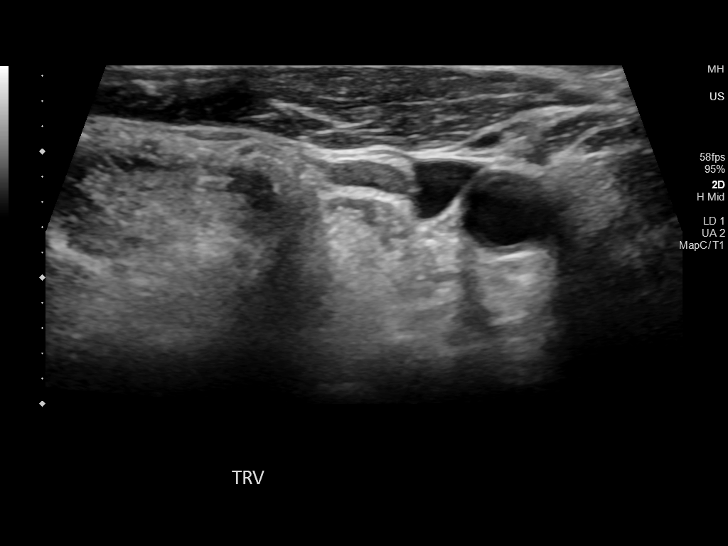
[im 30/56]
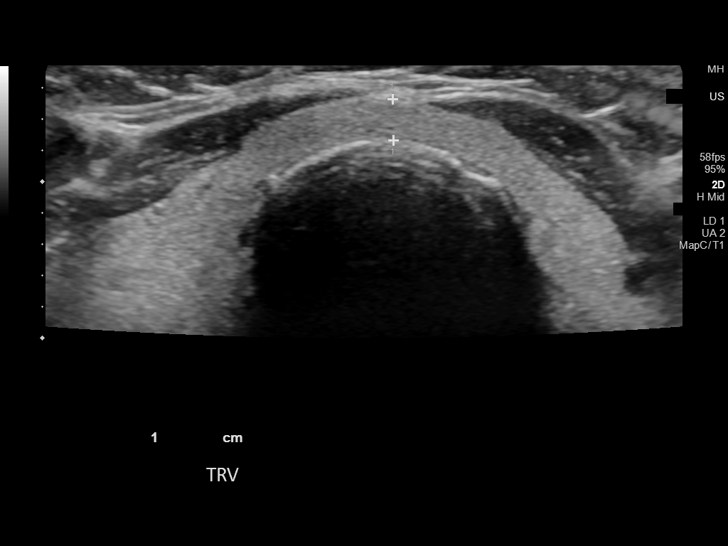
[im 35/56]
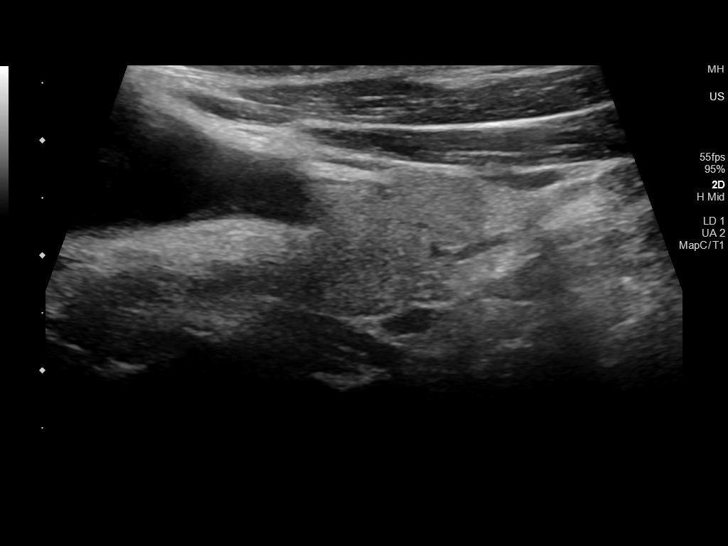
[im 37/56]
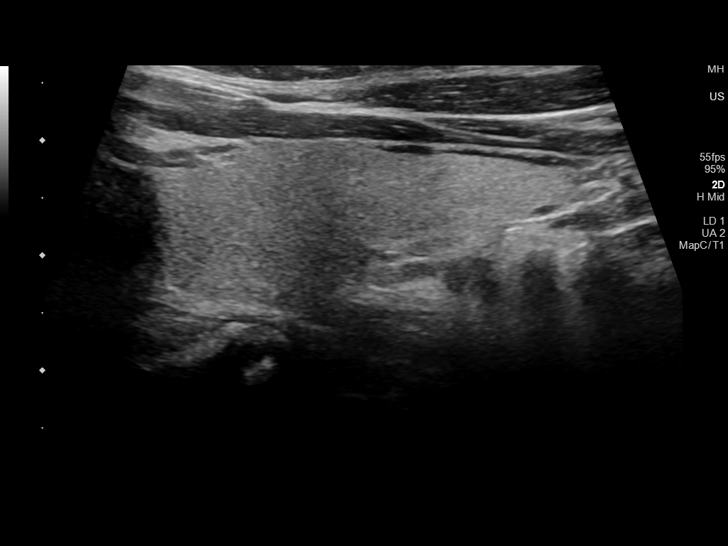
[im 42/56]
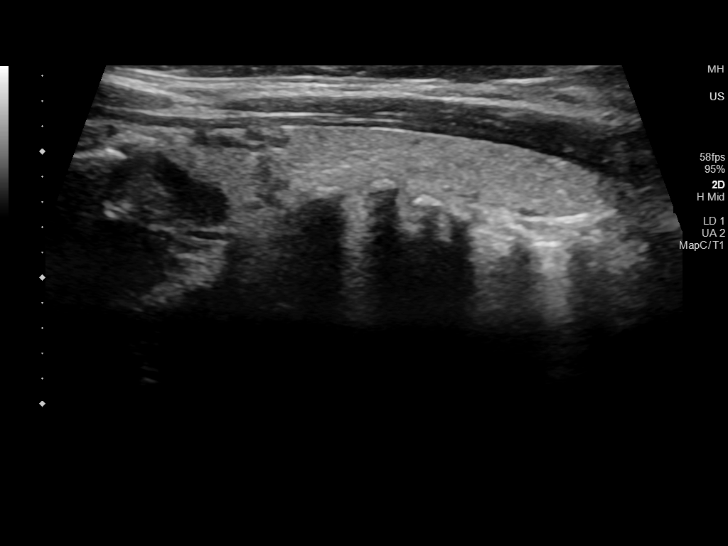
[im 46/56]
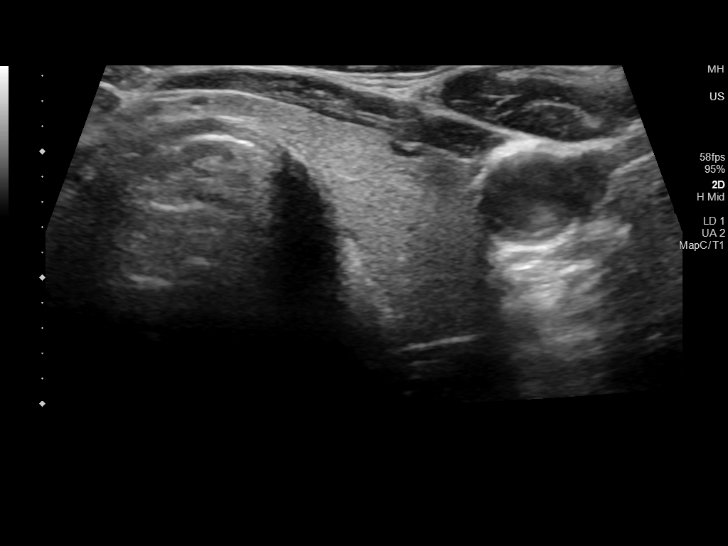
[im 51/56]
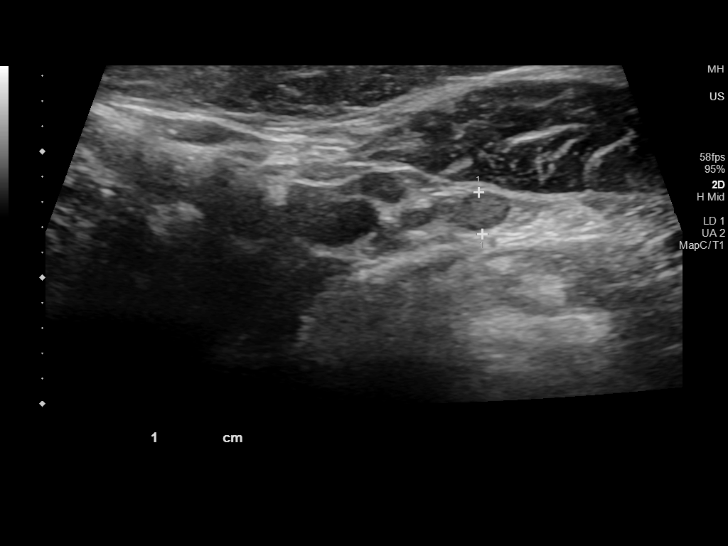
[im 56/56]
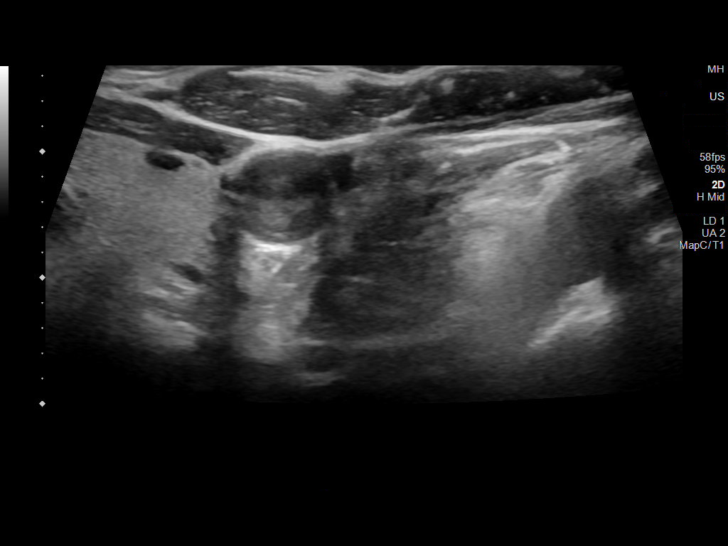

[14 of 25 positions shown; findings below may reference images not displayed]

FINDINGS: Parenchymal Echotexture: Mildly heterogenous

Isthmus: 0.3 cm thickness

Right lobe: 4.4 x 1.3 x 1.5 cm

Left lobe: 4 x 1.5 x 1.2 cm

_________________________________________________________

Estimated total number of nodules >/= 1 cm: 0

Number of spongiform nodules >/=  2 cm not described below (TR1): 0

Number of mixed cystic and solid nodules >/= 1.5 cm not described
below (TR2): 0

_________________________________________________________

0.5 cm hypoechoic nodule without calcifications, inferior right;
This nodule does NOT meet TI-RADS criteria for biopsy or dedicated
follow-up. No regional adenopathy identified.
IMPRESSION: 1. Normal-sized thyroid with a single 5 mm right nodule which does
not meet criteria for biopsy or dedicated imaging follow-up.

The above is in keeping with the ACR TI-RADS recommendations - [HOSPITAL] [01];[DATE].

## 2019-07-27 ENCOUNTER — Encounter: Payer: Self-pay | Admitting: Family Medicine

## 2019-08-25 ENCOUNTER — Encounter: Payer: Self-pay | Admitting: Family Medicine

## 2019-11-02 ENCOUNTER — Ambulatory Visit
Admission: RE | Admit: 2019-11-02 | Discharge: 2019-11-02 | Disposition: A | Discharge status: Home / Self Care | Payer: 59 | Source: Ambulatory Visit | Attending: Hematology and Oncology | Admitting: Hematology and Oncology

## 2019-11-02 ENCOUNTER — Other Ambulatory Visit: Payer: Self-pay

## 2019-11-02 DIAGNOSIS — Z1501 Genetic susceptibility to malignant neoplasm of breast: Secondary | ICD-10-CM

## 2019-11-02 IMAGING — MR MR BREAST BILAT WO/W CM
9 of 13 series · 33 of 48 positions shown · IV contrast (Multihance)
Comparison: [DATE] and prior mammograms

CLINICAL DATA: 69-year-old female for screening breast MRI. Patient
with high lifetime risk for developing breast cancer (30%) and [HA]
gene mutation.

LABS:  None performed today
EXAM:
BILATERAL BREAST MRI WITH AND WITHOUT CONTRAST
TECHNIQUE: Multiplanar, multisequence MR images of both breasts were obtained
prior to and following the intravenous administration of 6 ml of
Gadavist

[Series 2: t2_tirm_tra ipat (a-p) · axial · 3.0mm · 0.62mm/px · 1 of 19 slices shown (1 of 2)]
[im 1/19]
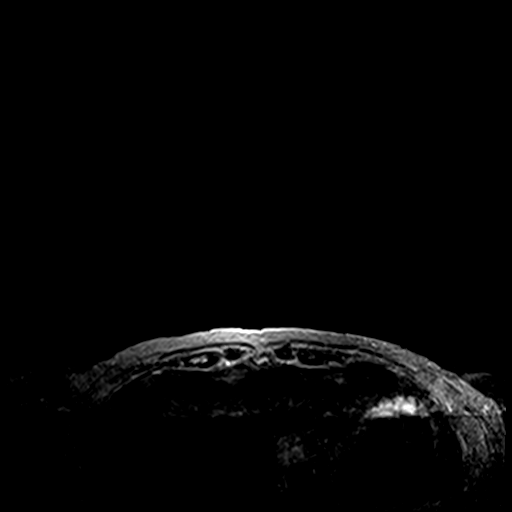

[Series 3: fl3d pre-cm no · axial · non-contrast · 1.2mm · 0.83mm/px · z∈[-89,+83]mm · 5 of 144 slices shown]
[im 1/144]
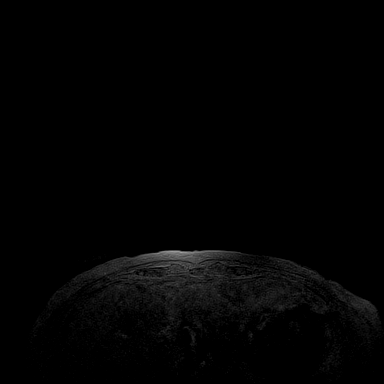
[im 36/144]
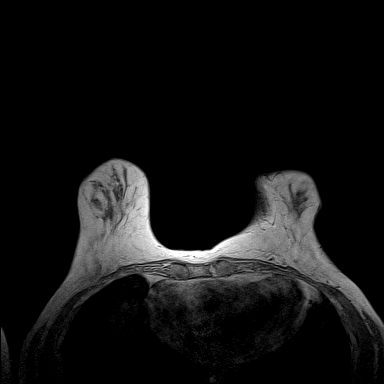
[im 72/144]
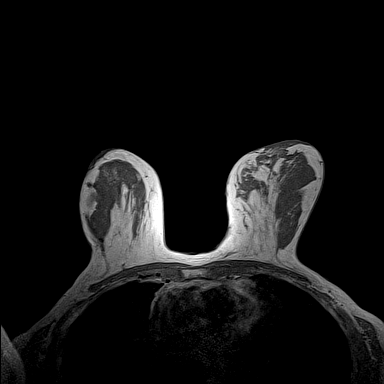
[im 108/144]
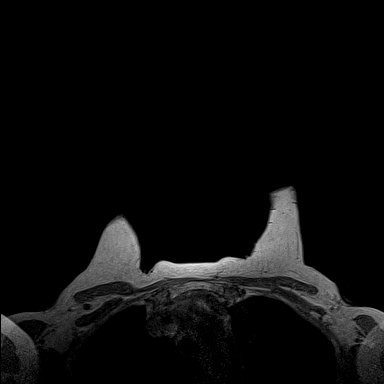
[im 144/144]
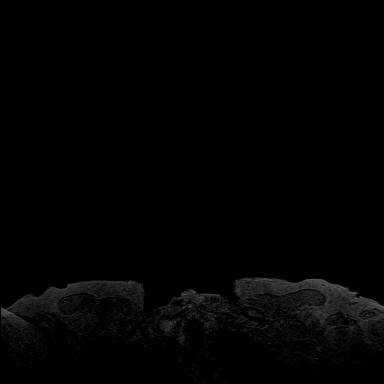

[Series 4: t2_tirm_tra ipat (a-p) · axial · 3.0mm · 0.62mm/px · 1 of 55 slices shown (2 of 2)]
[im 1/55]
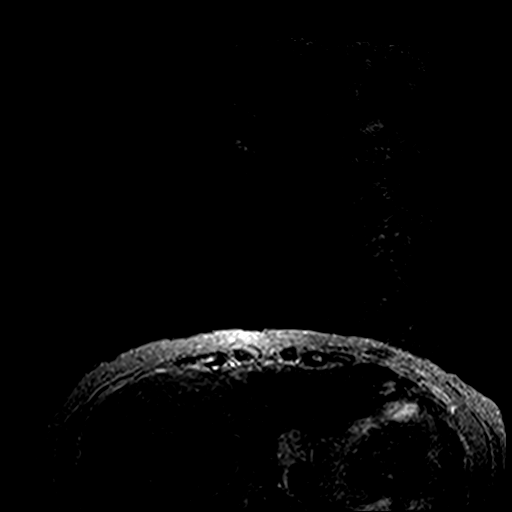

[Series 5: fl3d pre-cm · axial · non-contrast · 1.2mm · 0.83mm/px · z∈[-89,+83]mm · 5 of 144 slices shown]
[im 1/144]
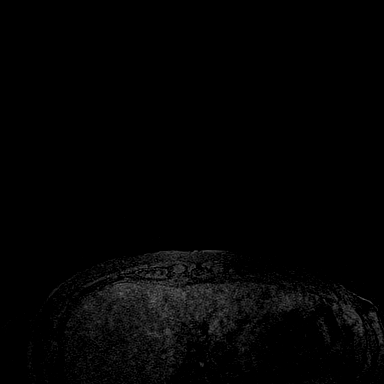
[im 36/144]
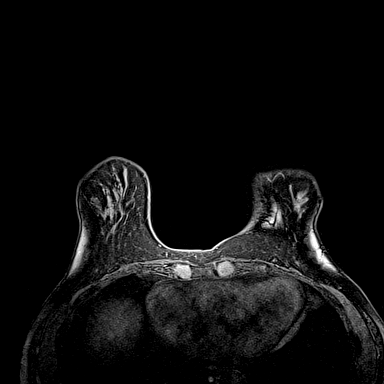
[im 72/144]
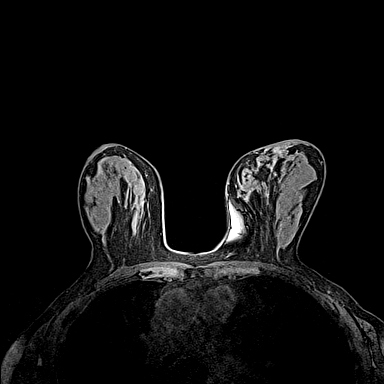
[im 108/144]
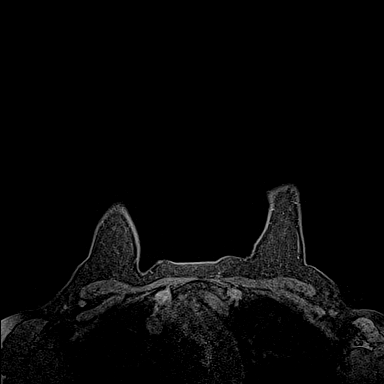
[im 144/144]
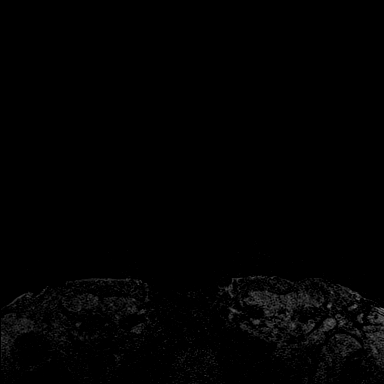

[Series 6: fl3d post-cm 20 · axial · 1.2mm · 0.83mm/px · z∈[-89,+83]mm · 5 of 144 slices shown (1 of 3)]
[im 1/144]
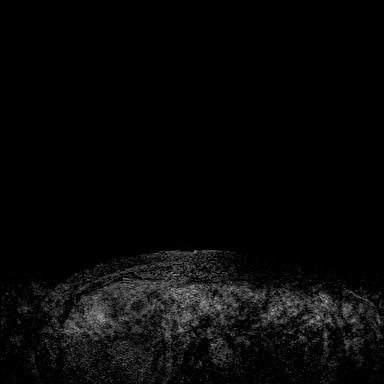
[im 36/144]
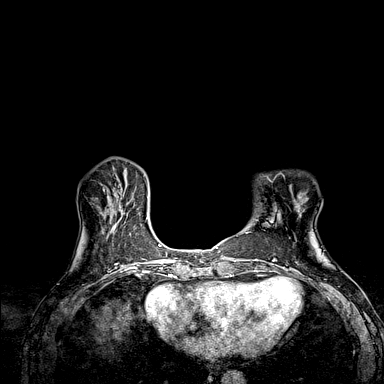
[im 72/144]
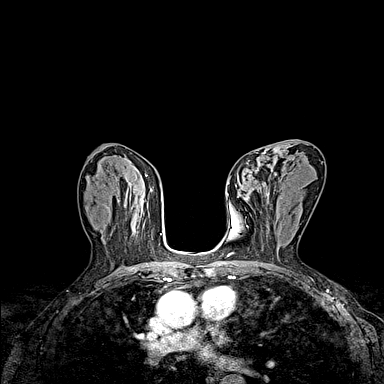
[im 108/144]
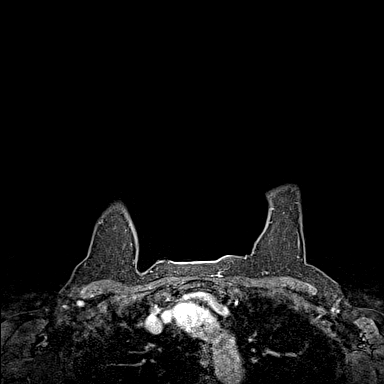
[im 144/144]
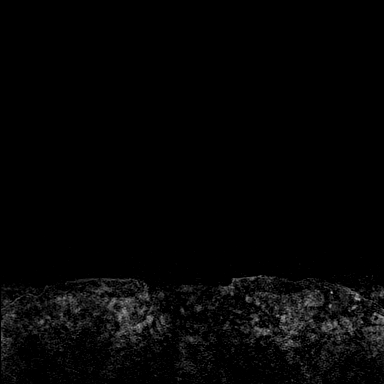

[Series 7: fl3d post-cm 20 · axial · 1.2mm · 0.83mm/px · z∈[-89,+83]mm · 5 of 144 slices shown (2 of 3)]
[im 1/144]
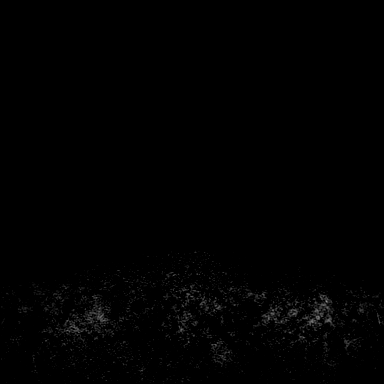
[im 36/144]
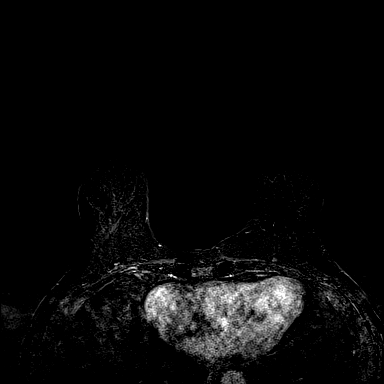
[im 72/144]
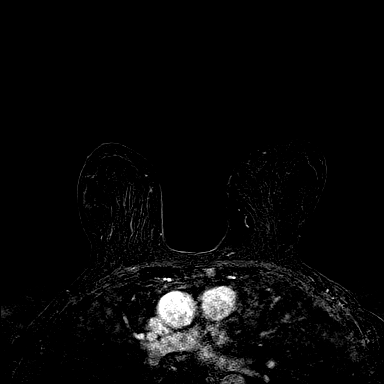
[im 108/144]
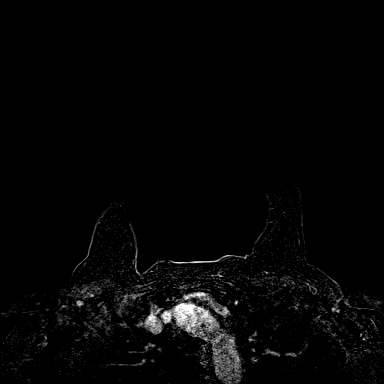
[im 144/144]
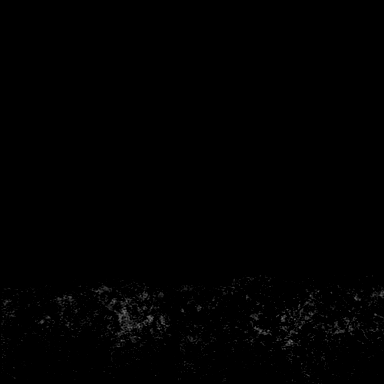

[Series 8: fl3d post-cm 20 · axial · 172.8mm · 0.83mm/px · 1 of 1 slices shown (3 of 3)]
[im 1/1]
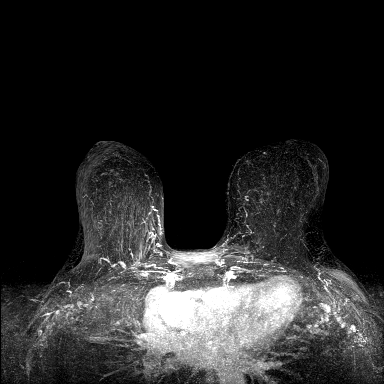

[Series 9: fl3d post-cm 3min · axial · 1.2mm · 0.83mm/px · z∈[-89,+83]mm · 5 of 144 slices shown]
[im 1/144]
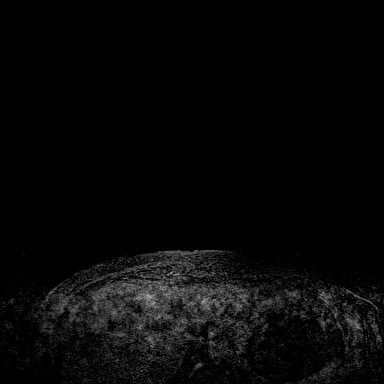
[im 36/144]
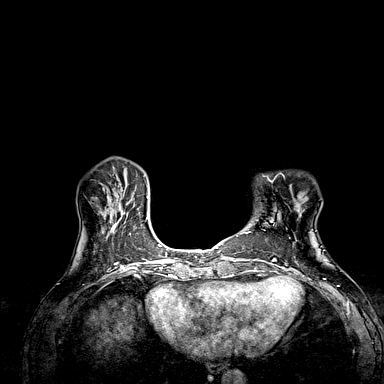
[im 72/144]
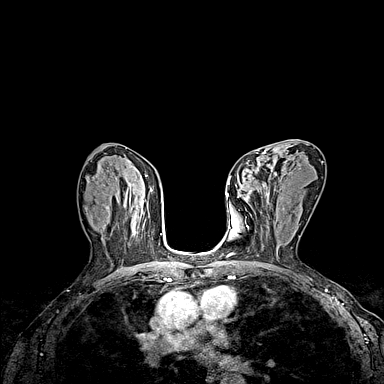
[im 108/144]
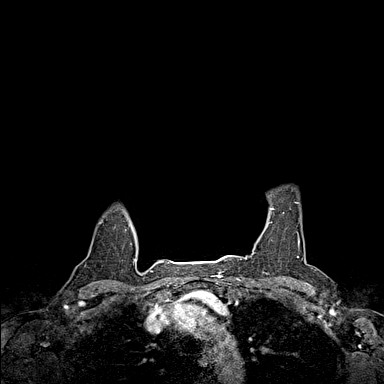
[im 144/144]
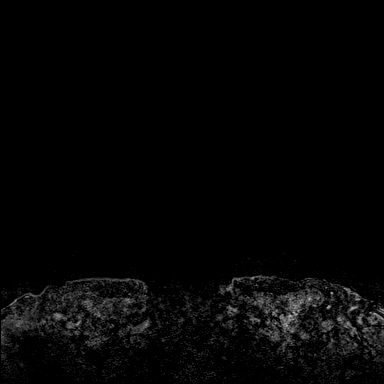

[Series 10: fl3d post-cm 3min_sub · axial · 1.2mm · 0.83mm/px · z∈[-89,+48]mm · 5 of 144 slices shown]
[im 1/144]
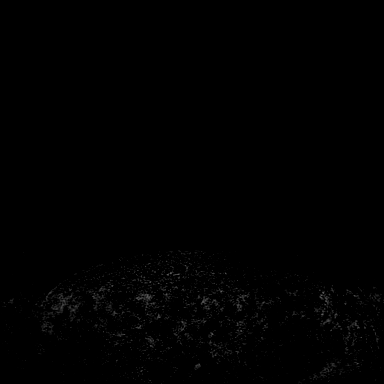
[im 29/144]
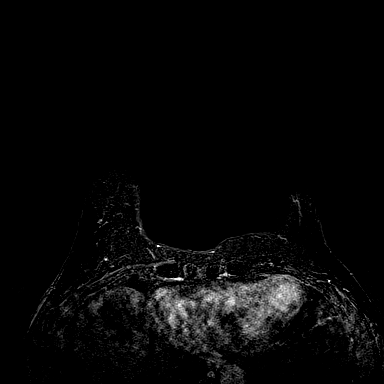
[im 58/144]
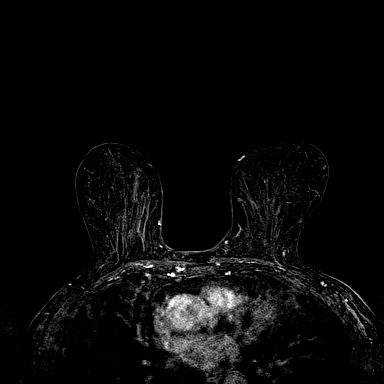
[im 86/144]
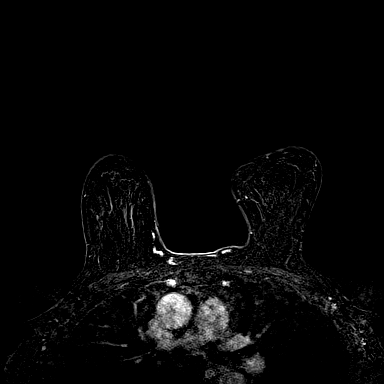
[im 115/144]
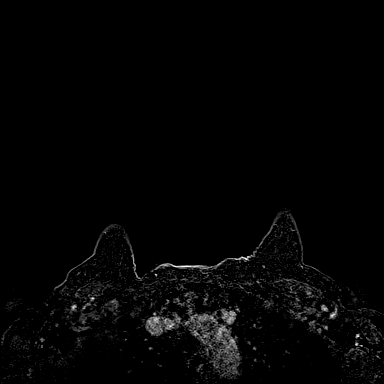

[33 of 48 positions shown; findings below may reference images not displayed]

Three-dimensional MR images were rendered by post-processing of the
original MR data on an independent workstation. The
three-dimensional MR images were interpreted, and findings are
reported in the following complete MRI report for this study. Three
dimensional images were evaluated at the independent [employer]
workstation
FINDINGS: Breast composition: d. Extreme fibroglandular tissue.

Background parenchymal enhancement: Mild

Right breast: No mass or abnormal enhancement.

Left breast: No mass or abnormal enhancement.

Lymph nodes: No abnormal appearing lymph nodes.

Ancillary findings:  None.
IMPRESSION: No MR evidence of breast malignancy.

RECOMMENDATION:
Bilateral screening mammogram in 6 months to resume annual mammogram
schedule.

Bilateral screening breast MRI in 1 year.

BI-RADS CATEGORY  1: Negative.

## 2019-11-02 MED ORDER — GADOBUTROL 1 MMOL/ML IV SOLN
6.0000 mL | Freq: Once | INTRAVENOUS | Status: AC | PRN
  Administered 2019-11-02: 6 mL via INTRAVENOUS

## 2019-12-30 ENCOUNTER — Encounter: Payer: Self-pay | Admitting: Family Medicine

## 2019-12-31 ENCOUNTER — Telehealth: Payer: Self-pay

## 2019-12-31 NOTE — Telephone Encounter (Signed)
Pt wants to set up cpe for September.

## 2020-01-01 ENCOUNTER — Encounter: Payer: Self-pay | Admitting: Family Medicine

## 2020-01-03 ENCOUNTER — Telehealth: Payer: Self-pay

## 2020-01-03 ENCOUNTER — Encounter: Payer: Self-pay | Admitting: Family Medicine

## 2020-01-03 ENCOUNTER — Telehealth (INDEPENDENT_AMBULATORY_CARE_PROVIDER_SITE_OTHER): Payer: Medicare Other | Admitting: Family Medicine

## 2020-01-03 VITALS — Ht 64.8 in

## 2020-01-03 DIAGNOSIS — J029 Acute pharyngitis, unspecified: Secondary | ICD-10-CM

## 2020-01-03 NOTE — Telephone Encounter (Signed)
Concerns addressed during visit today. Nadiah Corbit [name], MD

## 2020-01-03 NOTE — Telephone Encounter (Signed)
Pt sent a message in stating that the Medicare should be primary and UHC should be secondary.

## 2020-01-03 NOTE — Progress Notes (Signed)
Virtual Visit via Video Note I connected with Lindsey Kemp on 01/03/20 by a video enabled telemedicine application and verified that I am speaking with the correct person using two identifiers.  Location patient: home Location provider:work office Persons participating in the virtual visit: patient, provider  I discussed the limitations of evaluation and management by telemedicine and the availability of in person appointments. The patient expressed understanding and agreed to proceed.  Chief Complaint  Patient presents with  . throat pain   HPI: Lindsey Kemp is a 69 yo female with hx of osteoarthritis c/o sore throat. Lindsey Kemp had 4 days of sore throat. Lindsey Kemp throat was red and Lindsey Kemp had swollen glands. Treated problem with salty water. No OTC medication.  Lindsey Kemp has not had sore throat since yesterday. Yesterday Lindsey Kemp had sneezing spells for 30 minutes. Today Lindsey Kemp is feeling fine. Lindsey Kemp is concerned about possible COVID 19 Delta variant. Lindsey Kemp has not noted fever,chills,bodyaches,cough,wheezing,SOB,abdominal pain,N/V,changes in bowel habits, or skin rash. Negative for anosmia and ageusia.  No sick contact or recent travel. Lindsey Kemp started back to wearing mask   Inquiring about 3rd COVID 19 vaccine. Lindsey Kemp completed vaccination for COVID-19 in 06/2019. Lindsey Kemp has 2 daughter is getting married, beginning of 03/15/2020 and 04/15/2020, Lindsey Kemp was to be sure Lindsey Kemp still have immunity at the time Lindsey Kemp leaves town.  ROS: See pertinent positives and negatives per HPI.  Past Medical History:  Diagnosis Date  . Cancer [facility])    Breast (2011)    Past Surgical History:  Procedure Laterality Date  . APPENDECTOMY    . BRAIN SURGERY    . CESAREAN SECTION    . KNEE ARTHROCENTESIS     left   Family History  Problem Relation Age of Onset  . Cancer Mother    Social History   Socioeconomic History  . Marital status: Married    Spouse name: Not on file  . Number of children: Not on file  . Years of education: Not on  file  . Highest education level: Not on file  Occupational History  . Not on file  Tobacco Use  . Smoking status: Former Smoker    Types: Cigarettes  . Smokeless tobacco: Never Used  Substance and Sexual Activity  . Alcohol use: Yes    Comment: weekly  . Drug use: No  . Sexual activity: Yes  Other Topics Concern  . Not on file  Social History Narrative  . Not on file   Social Determinants of Health   Financial Resource Strain:   . Difficulty of Paying Living Expenses:   Food Insecurity:   . Worried About Charity fundraiser in the Last Year:   . Arboriculturist in the Last Year:   Transportation Needs:   . Film/video editor (Medical):   Marland Kitchen Lack of Transportation (Non-Medical):   Physical Activity:   . Days of Exercise per Week:   . Minutes of Exercise per Session:   Stress:   . Feeling of Stress :   Social Connections:   . Frequency of Communication with Friends and Family:   . Frequency of Social Gatherings with Friends and Family:   . Attends Religious Services:   . Active Member of Clubs or Organizations:   . Attends Archivist Meetings:   Marland Kitchen Marital Status:   Intimate Partner Violence:   . Fear of Current or Ex-Partner:   . Emotionally Abused:   Marland Kitchen Physically Abused:   . Sexually Abused:    No  current outpatient medications on file.  EXAM:  VITALS per patient if applicable:Ht 5' 4.8" (8.768 m)   BMI 21.82 kg/m   GENERAL: alert, oriented, appears well and in no acute distress  HEENT: atraumatic, conjunctiva clear, no obvious abnormalities on inspection of external nose and ears. Pharynx: No edema,erythema,or exudate. Uvula midline.  LUNGS: on inspection no signs of respiratory distress, breathing rate appears normal, no obvious gross SOB, gasping or wheezing  CV: no obvious cyanosis  PSYCH/NEURO: pleasant and cooperative, no obvious depression or anxiety, speech and thought processing grossly intact  ASSESSMENT AND PLAN:  Discussed the  following assessment and plan:  Sore throat  Resolved. Possible etiologies discussed. ? Allergies,viral illness.  History does not suggest COVID-19 infection but recommend keeping appointment for testing. Monitor for new symptoms. We will plan on checking COVID-19 antibodies next visit and decide about a 3rd dose vaccine. We discussed current recommendations.   I discussed the assessment and treatment plan with the patient. Lindsey Kemp was provided an opportunity to ask questions and all were answered. Lindsey Kemp agreed with the plan and demonstrated an understanding of the instructions.   Return if symptoms worsen or fail to improve.    Adelyne Marchese [name], MD

## 2020-01-04 ENCOUNTER — Encounter: Payer: Self-pay | Admitting: Family Medicine

## 2020-01-05 ENCOUNTER — Encounter: Payer: Self-pay | Admitting: Family Medicine

## 2020-01-10 ENCOUNTER — Other Ambulatory Visit: Payer: Self-pay

## 2020-01-10 ENCOUNTER — Other Ambulatory Visit (INDEPENDENT_AMBULATORY_CARE_PROVIDER_SITE_OTHER): Payer: Medicare Other

## 2020-01-10 DIAGNOSIS — Z0184 Encounter for antibody response examination: Secondary | ICD-10-CM

## 2020-01-11 ENCOUNTER — Encounter: Payer: Self-pay | Admitting: Family Medicine

## 2020-01-11 LAB — SARS COV-2 SEROLOGY(COVID-19)AB(IGG,IGM),IMMUNOASSAY
SARS CoV-2 AB IgG: NEGATIVE
SARS CoV-2 IgM: NEGATIVE

## 2020-01-25 ENCOUNTER — Ambulatory Visit: Payer: Self-pay | Attending: Internal Medicine

## 2020-01-25 DIAGNOSIS — Z23 Encounter for immunization: Secondary | ICD-10-CM

## 2020-01-25 NOTE — Progress Notes (Signed)
   Covid-19 Vaccination Clinic  Name:  Lindsey Kemp    MRN: [id number] DOB: 12-13-1950  01/25/2020  Lindsey Kemp was observed post Covid-19 immunization for 15 minutes without incident. She was provided with Vaccine Information Sheet and instruction to access the V-Safe system.   Lindsey Kemp was instructed to call 911 with any severe reactions post vaccine: Marland Kitchen Difficulty breathing  . Swelling of face and throat  . A fast heartbeat  . A bad rash all over body  . Dizziness and weakness

## 2020-02-10 ENCOUNTER — Other Ambulatory Visit: Payer: Self-pay

## 2020-02-10 DIAGNOSIS — E785 Hyperlipidemia, unspecified: Secondary | ICD-10-CM | POA: Insufficient documentation

## 2020-02-11 ENCOUNTER — Ambulatory Visit (INDEPENDENT_AMBULATORY_CARE_PROVIDER_SITE_OTHER): Payer: Medicare Other | Admitting: Family Medicine

## 2020-02-11 ENCOUNTER — Encounter: Payer: Self-pay | Admitting: Family Medicine

## 2020-02-11 VITALS — BP 100/70 | HR 79 | Temp 98.1°F | Resp 16 | Ht 64.8 in | Wt 127.5 lb

## 2020-02-11 DIAGNOSIS — E785 Hyperlipidemia, unspecified: Secondary | ICD-10-CM | POA: Diagnosis not present

## 2020-02-11 DIAGNOSIS — Z23 Encounter for immunization: Secondary | ICD-10-CM

## 2020-02-11 DIAGNOSIS — R7303 Prediabetes: Secondary | ICD-10-CM | POA: Diagnosis not present

## 2020-02-11 DIAGNOSIS — Z0000 Encounter for general adult medical examination without abnormal findings: Secondary | ICD-10-CM

## 2020-02-11 NOTE — Patient Instructions (Addendum)
  Lindsey Kemp , Thank you for taking time to come for your Medicare Wellness Visit. I appreciate your ongoing commitment to your health goals. Please review the following plan we discussed and let me know if I can assist you in the future.   These are the goals we discussed: Goals   None     This is a list of the screening recommended for you and due dates:  Health Maintenance  Topic Date Due  . Mammogram  Never done  . Flu Shot  12/26/2019  . Tetanus Vaccine  02/10/2021*  . Colon Cancer Screening  07/26/2029  . DEXA scan (bone density measurement)  Completed  . COVID-19 Vaccine  Completed  .  Hepatitis C: One time screening is recommended by Center for Disease Control  (CDC) for  adults born from [birth year] through 1964.   Discontinued  . Pneumonia vaccines  Discontinued  *Topic was postponed. The date shown is not the original due date.   A few tips:  -As we age balance is not as good as it was, so there is a higher risks for falls. Please remove small rugs and furniture that is "in your way" and could increase the risk of falls. Stretching exercises may help with fall prevention: Yoga and Tai Chi are some examples. Low impact exercise is better, so you are not very achy the next day.  -Sun screen and avoidance of direct sun light recommended. Caution with dehydration, if working outdoors be sure to drink enough fluids.  - Some medications are not safe as we age, increases the risk of side effects and can potentially interact with other medication you are also taken;  including some of over the counter medications. Be sure to let me know when you start a new medication even if it is a dietary/vitamin supplement.   -Healthy diet low in red meet/animal fat and sugar + regular physical activity is recommended.    A few things to remember from today's visit:  Routine general medical examination at a health care facility  Hyperlipidemia, unspecified hyperlipidemia type - Plan: BASIC  METABOLIC PANEL WITH GFR, Lipid panel  Prediabetes - Plan: BASIC METABOLIC PANEL WITH GFR, Hemoglobin A1c  Medicare annual wellness visit, subsequent   Continue a healthful diet and regular physical activity.

## 2020-02-11 NOTE — Progress Notes (Signed)
HPI: [name] is a 69 y.o. female, who is here today for her routine physical and AWV  She does not remember having a AWV.  She lives with her husband.. Independent ADL's and IADL's.  Functional Status Survey: Is the patient deaf or have difficulty hearing?: No Does the patient have difficulty seeing, even when wearing glasses/contacts?: No Does the patient have difficulty concentrating, remembering, or making decisions?: No Does the patient have difficulty walking or climbing stairs?: No Does the patient have difficulty dressing or bathing?: No Does the patient have difficulty doing errands alone such as visiting a doctor's office or shopping?: No  Fall Risk  02/11/2020  Falls in the past year? 0  Number falls in past yr: 0  Injury with Fall? 0  Follow up Education provided   Providers she sees regularly: Neurologist,Dr Pinyan. She had an UE's EMG, Dx'ed with carpal tunnel syndrome. Orthopedist, Dr. Fredna Kemp Oncologist, Dr. Lindi Kemp  Depression screen [facility] 2/9 02/11/2020  Decreased Interest 0  Down, Depressed, Hopeless 0  PHQ - 2 Score 0     Mini-Cog - 02/11/20 1105    Normal clock drawing test? yes    How many words correct? 3           Hearing Screening   '125Hz'  '250Hz'  '500Hz'  '1000Hz'  '2000Hz'  '3000Hz'  '4000Hz'  '6000Hz'  '8000Hz'   Right ear:           Left ear:           Vision Screening Comments: Refused.  Last CPE: 02/10/2019.  Regular exercise 3 or more time per week: Yes, she walks 4 miles per daily x 7 d and goes to the gyn 4 times per week. Following a healthy diet: Yes.  Plenty of vegetables, she cooks at home.  Chronic medical problems: Hyperlipidemia, monoallelic mutation of CHEK2, hand OA.  Immunization History  Administered Date(s) Administered  . Fluad Quad(high Dose 65+) 02/10/2019, 02/11/2020  . Influenza-Unspecified 01/27/2018  . PFIZER SARS-COV-2 Vaccination 06/24/2019, 07/22/2019, 01/25/2020  . Zoster Recombinat (Shingrix) 05/14/2018, 07/13/2018    Mammogram: 05/03/19. She alternates between mammogram and MRI. Last breast MRI on 11/02/19: Bi-Rads I. Colonoscopy: 07/27/19. 3-year follow-up was recommended. DEXA: 06/11/18, osteoporosis,managed by her gynecologist. Takes Ca++ and vit D. She is not on pharmacologic treatment.  Hyperlipidemia: Currently she is on nonpharmacologic treatment.  Lab Results  Component Value Date   CHOL 225 (H) 02/10/2019   HDL 82.30 02/10/2019   LDLCALC 127 (H) 02/10/2019   TRIG 79.0 02/10/2019   CHOLHDL 3 02/10/2019   Prediabetes: Lab Results  Component Value Date   HGBA1C 5.8 02/10/2019   Negative for polydipsia,polyuria, or polyphagia.  In general she feels well, she is concerned about her husband alcohol intake and dietary habits. He has an appt coming and would like for me to discuss these issues with him.  She and her husband will be moving to [place] around 06/2019.  Review of Systems  Constitutional: Negative for appetite change, fatigue and fever.  HENT: Negative for dental problem, hearing loss, mouth sores and sore throat.   Eyes: Negative for redness and visual disturbance.  Respiratory: Negative for cough, shortness of breath and wheezing.   Cardiovascular: Negative for chest pain and leg swelling.  Gastrointestinal: Negative for abdominal pain, nausea and vomiting.       No changes in bowel habits.  Endocrine: Negative for cold intolerance, heat intolerance, polydipsia, polyphagia and polyuria.  Genitourinary: Negative for decreased urine volume, dysuria, hematuria, vaginal bleeding and vaginal discharge.  Musculoskeletal: Positive for arthralgias. Negative for gait problem.  Skin: Negative for color change and rash.  Allergic/Immunologic: Negative for environmental allergies.  Neurological: Negative for syncope, weakness and headaches.  Hematological: Negative for adenopathy. Does not bruise/bleed easily.  Psychiatric/Behavioral: Negative for confusion and sleep disturbance.  All  other systems reviewed and are negative.  No current outpatient medications on file prior to visit.   No current facility-administered medications on file prior to visit.   Past Medical History:  Diagnosis Date  . Cancer [facility])    Breast (2011)   Past Surgical History:  Procedure Laterality Date  . APPENDECTOMY    . BRAIN SURGERY    . CESAREAN SECTION    . KNEE ARTHROCENTESIS     left   Allergies  Allergen Reactions  . Codeine Nausea And Vomiting  . Hydrocodone-Acetaminophen Dermatitis, Nausea Only and Other (See Comments)   Family History  Problem Relation Age of Onset  . Cancer Mother    Social History   Socioeconomic History  . Marital status: Married    Spouse name: Not on file  . Number of children: Not on file  . Years of education: Not on file  . Highest education level: Not on file  Occupational History  . Not on file  Tobacco Use  . Smoking status: Former Smoker    Types: Cigarettes  . Smokeless tobacco: Never Used  Substance and Sexual Activity  . Alcohol use: Yes    Comment: weekly  . Drug use: No  . Sexual activity: Yes  Other Topics Concern  . Not on file  Social History Narrative  . Not on file   Social Determinants of Health   Financial Resource Strain:   . Difficulty of Paying Living Expenses: Not on file  Food Insecurity:   . Worried About Charity fundraiser in the Last Year: Not on file  . Ran Out of Food in the Last Year: Not on file  Transportation Needs:   . Lack of Transportation (Medical): Not on file  . Lack of Transportation (Non-Medical): Not on file  Physical Activity:   . Days of Exercise per Week: Not on file  . Minutes of Exercise per Session: Not on file  Stress:   . Feeling of Stress : Not on file  Social Connections:   . Frequency of Communication with Friends and Family: Not on file  . Frequency of Social Gatherings with Friends and Family: Not on file  . Attends Religious Services: Not on file  . Active Member  of Clubs or Organizations: Not on file  . Attends Archivist Meetings: Not on file  . Marital Status: Not on file   Vitals:   02/11/20 1026  BP: 100/70  Pulse: 79  Resp: 16  Temp: 98.1 F (36.7 C)  SpO2: 97%   Body mass index is 21.35 kg/m.  Wt Readings from Last 3 Encounters:  02/11/20 127 lb 8 oz (57.8 kg)  06/16/19 130 lb 4.8 oz (59.1 kg)  02/10/19 129 lb 2 oz (58.6 kg)   Physical Exam Vitals and nursing note reviewed.  Constitutional:      General: She is not in acute distress.    Appearance: She is well-developed, well-groomed and normal weight.  HENT:     Head: Normocephalic and atraumatic.     Right Ear: Hearing, tympanic membrane, ear canal and external ear normal.     Left Ear: Hearing, tympanic membrane, ear canal and external ear normal.  Mouth/Throat:     Mouth: Mucous membranes are moist.     Pharynx: Oropharynx is clear. Uvula midline.  Eyes:     Extraocular Movements: Extraocular movements intact.     Conjunctiva/sclera: Conjunctivae normal.     Pupils: Pupils are equal, round, and reactive to light.  Neck:     Thyroid: No thyromegaly.     Trachea: No tracheal deviation.  Cardiovascular:     Rate and Rhythm: Normal rate and regular rhythm.     Pulses:          Dorsalis pedis pulses are 2+ on the right side and 2+ on the left side.     Heart sounds: No murmur heard.   Pulmonary:     Effort: Pulmonary effort is normal. No respiratory distress.     Breath sounds: Normal breath sounds.  Abdominal:     Palpations: Abdomen is soft. There is no hepatomegaly or mass.     Tenderness: There is no abdominal tenderness.  Genitourinary:    Comments: Deferred to gyn. Musculoskeletal:     Comments: No signs of synovitis appreciated.  Lymphadenopathy:     Cervical: No cervical adenopathy.     Upper Body:     Right upper body: No supraclavicular adenopathy.     Left upper body: No supraclavicular adenopathy.  Skin:    General: Skin is warm.      Findings: No erythema or rash.  Neurological:     Mental Status: She is alert and oriented to person, place, and time.     Cranial Nerves: No cranial nerve deficit.     Coordination: Coordination normal.     Gait: Gait normal.     Deep Tendon Reflexes:     Reflex Scores:      Bicep reflexes are 2+ on the right side and 2+ on the left side.      Patellar reflexes are 2+ on the right side and 2+ on the left side. Psychiatric:        Mood and Affect: Mood and affect normal.        Cognition and Memory: Cognition normal.     Comments: Well groomed, good eye contact.   ASSESSMENT AND PLAN:  Ms. Lindsey Kemp was here today annual physical examination.  Orders Placed This Encounter  Procedures  . Flu Vaccine QUAD High Dose(Fluad)  . BASIC METABOLIC PANEL WITH GFR  . Lipid panel  . Hemoglobin A1c   Lab Results  Component Value Date   HGBA1C 5.4 02/11/2020   Lab Results  Component Value Date   CHOL 223 (H) 02/11/2020   HDL 79 02/11/2020   LDLCALC 127 (H) 02/11/2020   TRIG 71 02/11/2020   CHOLHDL 2.8 02/11/2020   Lab Results  Component Value Date   CREATININE 0.75 02/11/2020   BUN 23 02/11/2020   NA 136 02/11/2020   K 4.6 02/11/2020   CL 100 02/11/2020   CO2 29 02/11/2020   Routine general medical examination at a health care facility We discussed the importance of regular physical activity and healthy diet for prevention of chronic illness and/or complications. Preventive guidelines reviewed. Continue following with gyn for her female preventive care.  Ca++ and vit D supplementation to continue. Next CPE in a year.  The 10-year ASCVD risk score Mikey Bussing DC Jr., et al., 2013) is: 4.5%   Values used to calculate the score:     Age: 38 years     Sex: Female     Is  Non-Hispanic African American: No     Diabetic: No     Tobacco smoker: No     Systolic Blood Pressure: 299 mmHg     Is BP treated: No     HDL Cholesterol: 79 mg/dL     Total Cholesterol: 223  mg/dL  Hyperlipidemia, unspecified hyperlipidemia type -     BASIC METABOLIC PANEL WITH GFR; Future -     Lipid panel; Future -     Lipid panel -     BASIC METABOLIC PANEL WITH GFR  Prediabetes Continue a healthy life style for primary prevention. Further recommendations according to HgA1C result.  Medicare annual wellness visit, subsequent We discussed the importance of staying active, physically and mentally, as well as the benefits of a healthy/balance diet. Low impact exercise to continue, stretching and strengthing are ideal. Vaccines up to date. We discussed preventive screening for the next 5-10 years, summery of recommendations given in AVS. Fall prevention. Colonoscopy in 07/2022. Mammogram in 10/2020.  Advance directives and end of life discussed, she has POA and living will.   Need for influenza vaccination -     Flu Vaccine QUAD High Dose(Fluad)   Return in about 1 year (around 02/10/2021) for CPE and AWV in [place]..  Jaely Silman G. [name], MD  [facility]. [place] office.    Ms. Lindsey Kemp , Thank you for taking time to come for your Medicare Wellness Visit. I appreciate your ongoing commitment to your health goals. Please review the following plan we discussed and let me know if I can assist you in the future.   These are the goals we discussed: Goals   None     This is a list of the screening recommended for you and due dates:  Health Maintenance  Topic Date Due  . Mammogram  Never done  . Flu Shot  12/26/2019  . Tetanus Vaccine  02/10/2021*  . Colon Cancer Screening  07/26/2029  . DEXA scan (bone density measurement)  Completed  . COVID-19 Vaccine  Completed  .  Hepatitis C: One time screening is recommended by Center for Disease Control  (CDC) for  adults born from [birth year] through 1964.   Discontinued  . Pneumonia vaccines  Discontinued  *Topic was postponed. The date shown is not the original due date.   A few tips:  -As we age balance is not  as good as it was, so there is a higher risks for falls. Please remove small rugs and furniture that is "in your way" and could increase the risk of falls. Stretching exercises may help with fall prevention: Yoga and Tai Chi are some examples. Low impact exercise is better, so you are not very achy the next day.  -Sun screen and avoidance of direct sun light recommended. Caution with dehydration, if working outdoors be sure to drink enough fluids.  - Some medications are not safe as we age, increases the risk of side effects and can potentially interact with other medication you are also taken;  including some of over the counter medications. Be sure to let me know when you start a new medication even if it is a dietary/vitamin supplement.   -Healthy diet low in red meet/animal fat and sugar + regular physical activity is recommended.    A few things to remember from today's visit:  Routine general medical examination at a health care facility  Hyperlipidemia, unspecified hyperlipidemia type - Plan: BASIC METABOLIC PANEL WITH GFR, Lipid panel  Prediabetes - Plan: BASIC  METABOLIC PANEL WITH GFR, Hemoglobin A1c  Medicare annual wellness visit, subsequent   Continue a healthful diet and regular physical activity.

## 2020-02-12 LAB — LIPID PANEL
Cholesterol: 223 mg/dL — ABNORMAL HIGH (ref <200)
HDL: 79 mg/dL (ref >=50)
LDL Cholesterol (Calc): 127 mg/dL (calc) — ABNORMAL HIGH
Non-HDL Cholesterol (Calc): 144 mg/dL (calc) — ABNORMAL HIGH (ref <130)
Total CHOL/HDL Ratio: 2.8 (calc) (ref <5.0)
Triglycerides: 71 mg/dL (ref <150)

## 2020-02-12 LAB — BASIC METABOLIC PANEL WITH GFR
BUN: 23 mg/dL (ref 7–25)
CO2: 29 mmol/L (ref 20–32)
Calcium: 9.3 mg/dL (ref 8.6–10.4)
Chloride: 100 mmol/L (ref 98–110)
Creat: 0.75 mg/dL (ref 0.50–0.99)
GFR, Est African American: 95 mL/min/{1.73_m2} (ref >=60)
GFR, Est Non African American: 82 mL/min/{1.73_m2} (ref >=60)
Glucose, Bld: 91 mg/dL (ref 65–99)
Potassium: 4.6 mmol/L (ref 3.5–5.3)
Sodium: 136 mmol/L (ref 135–146)

## 2020-02-12 LAB — HEMOGLOBIN A1C
Hgb A1c MFr Bld: 5.4 % of total Hgb (ref <5.7)
Mean Plasma Glucose: 108 (calc)
eAG (mmol/L): 6 (calc)

## 2020-02-23 ENCOUNTER — Emergency Department (HOSPITAL_COMMUNITY)
Admission: EM | Admit: 2020-02-23 | Discharge: 2020-02-23 | Discharge status: Absent without leave | Payer: Medicare Other

## 2020-05-25 ENCOUNTER — Other Ambulatory Visit: Payer: Self-pay | Admitting: Obstetrics

## 2020-05-25 DIAGNOSIS — M810 Age-related osteoporosis without current pathological fracture: Secondary | ICD-10-CM

## 2020-06-01 ENCOUNTER — Ambulatory Visit
Admission: RE | Admit: 2020-06-01 | Discharge: 2020-06-01 | Disposition: A | Discharge status: Home / Self Care | Payer: Medicare Other | Source: Ambulatory Visit | Attending: Obstetrics | Admitting: Obstetrics

## 2020-06-01 ENCOUNTER — Other Ambulatory Visit: Payer: Self-pay

## 2020-06-01 DIAGNOSIS — M810 Age-related osteoporosis without current pathological fracture: Secondary | ICD-10-CM

## 2020-06-06 ENCOUNTER — Telehealth: Payer: Self-pay | Admitting: Hematology and Oncology

## 2020-06-06 NOTE — Telephone Encounter (Signed)
Rescheduled 1/20 appt. Called and spoke with pt, confirmed 1/24 appt

## 2020-06-09 ENCOUNTER — Encounter: Payer: Self-pay | Admitting: Family Medicine

## 2020-06-10 ENCOUNTER — Other Ambulatory Visit: Payer: Self-pay

## 2020-06-13 ENCOUNTER — Other Ambulatory Visit: Payer: Self-pay | Admitting: Family Medicine

## 2020-06-13 DIAGNOSIS — M810 Age-related osteoporosis without current pathological fracture: Secondary | ICD-10-CM

## 2020-06-15 ENCOUNTER — Ambulatory Visit: Payer: 59 | Admitting: Hematology and Oncology

## 2020-06-18 NOTE — Progress Notes (Signed)
Patient Care Team: [name], [name], MD as PCP - General (Family Medicine)  DIAGNOSIS:    ICD-10-CM   1. Monoallelic mutation of CHEK2 gene in female patient  Z15.01 CANCELED: MR BREAST BILATERAL W [place] CAD   Z15.89    Z15.09    Z15.02     CHIEF COMPLIANT: Follow-up of breast cancer  INTERVAL HISTORY: Lindsey Kemp is a 70 y.o. with above-mentioned history of CHEK2 mutation and invasive lobular breast cancer treated with lumpectomy, radiation, and refused antiestrogen therapy. Breast MRI on 11/02/19 showed no evidence of malignancy bilaterally. She presents to the clinic today for follow-up.  She denies any lumps or nodules in the breast.  ALLERGIES:  is allergic to codeine and hydrocodone-acetaminophen.  MEDICATIONS:  Algical PHYSICAL EXAMINATION: ECOG PERFORMANCE STATUS: 1 - Symptomatic but completely ambulatory  Vitals:   06/19/20 0935  BP: (!) 146/70  Pulse: 76  Resp: 18  Temp: 98.1 F (36.7 C)  SpO2: 100%   Filed Weights   06/19/20 0935  Weight: 130 lb 4.8 oz (59.1 kg)    BREAST: No palpable masses or nodules in either right or left breasts. No palpable axillary supraclavicular or infraclavicular adenopathy no breast tenderness or nipple discharge. (exam performed in the presence of a chaperone)  LABORATORY DATA:  I have reviewed the data as listed CMP Latest Ref Rng & Units 02/11/2020 02/10/2019  Glucose 65 - 99 mg/dL 91 89  BUN 7 - 25 mg/dL 23 18  Creatinine 0.50 - 0.99 mg/dL 0.75 0.66  Sodium 135 - 146 mmol/L 136 139  Potassium 3.5 - 5.3 mmol/L 4.6 4.6  Chloride 98 - 110 mmol/L 100 97  CO2 20 - 32 mmol/L 29 34(H)  Calcium 8.6 - 10.4 mg/dL 9.3 10.4    Lab Results  Component Value Date   WBC 5.3 11/10/2018   HGB 12.8 11/10/2018   HCT 38.7 11/10/2018   MCV 89.4 11/10/2018   PLT 263.0 11/10/2018    ASSESSMENT & PLAN:  Monoallelic mutation of CHEK2 gene in female patient Genetic testing: 05/06/2018: Pathogenic variant of CHEK2 mutation  identified.  Heterozygous, 1 pathogenic variant identified in [place]  CHEK-2 Breast cancer risk: Lifetime risk of breast cancer with heterozygous CHEK2 mutation: 25 to 39% Additional cancer risks include colorectal, thyroid cancers but their risk is very low.  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  Renal ultrasound if there is hematuria.  Breast cancer surveillance: 1.  Breast MRI 11/02/2019: No evidence of malignancy 2. Mammogram: She gets her mammograms done and her gynecologist office with Dr. Valentino Saxon.  Osteoporosis: Patient is on algical.  She does not want to take any bisphosphonate therapy.  Thyroid ultrasound 06/23/2019: Normal size thyroid with a single 5 mm right nodule  She is moving to [place] near [place] starting March 2022. Future appointments will be in [place] for her.    No orders of the defined types were placed in this encounter.  The patient has a good understanding of the overall plan. she agrees with it. she will call with any problems that may develop before the next visit here.  Total time spent: 20 mins including face to face time and time spent for planning, charting and coordination of care  Lindsey Lose, MD 06/19/2020  Lindsey Kemp, am acting as scribe for Lindsey Kemp.  I have reviewed the above documentation for accuracy and completeness, and I agree with the above.

## 2020-06-19 ENCOUNTER — Other Ambulatory Visit: Payer: Self-pay

## 2020-06-19 ENCOUNTER — Inpatient Hospital Stay: Payer: Medicare Other | Attending: Hematology and Oncology | Admitting: Hematology and Oncology

## 2020-06-19 ENCOUNTER — Encounter: Payer: Self-pay | Admitting: Hematology and Oncology

## 2020-06-19 DIAGNOSIS — M810 Age-related osteoporosis without current pathological fracture: Secondary | ICD-10-CM | POA: Insufficient documentation

## 2020-06-19 DIAGNOSIS — Z1589 Genetic susceptibility to other disease: Secondary | ICD-10-CM | POA: Diagnosis not present

## 2020-06-19 DIAGNOSIS — Z1502 Genetic susceptibility to malignant neoplasm of ovary: Secondary | ICD-10-CM

## 2020-06-19 DIAGNOSIS — Z1509 Genetic susceptibility to other malignant neoplasm: Secondary | ICD-10-CM

## 2020-06-19 DIAGNOSIS — Z1501 Genetic susceptibility to malignant neoplasm of breast: Secondary | ICD-10-CM | POA: Diagnosis not present

## 2020-06-19 DIAGNOSIS — Z853 Personal history of malignant neoplasm of breast: Secondary | ICD-10-CM | POA: Insufficient documentation

## 2020-06-19 NOTE — Assessment & Plan Note (Signed)
Genetic testing: 05/06/2018: Pathogenic variant of CHEK2 mutation identified.  Heterozygous, 1 pathogenic variant identified in [place]  CHEK-2 Breast cancer risk: Lifetime risk of breast cancer with heterozygous CHEK2 mutation: 25 to 39% Additional cancer risks include colorectal, thyroid cancers but their risk is very low.  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  Renal ultrasound if there is hematuria.  Breast cancer surveillance: 1.  Breast MRI 11/02/2019: No evidence of malignancy 2. Mammogram:  Thyroid ultrasound 06/23/2019: Normal size thyroid with a single 5 mm right nodule  Return to clinic in 1 year for follow-up

## 2020-07-29 ENCOUNTER — Encounter: Payer: Self-pay | Admitting: Hematology and Oncology

## 2020-08-01 ENCOUNTER — Other Ambulatory Visit: Payer: Self-pay

## 2020-08-01 DIAGNOSIS — Z1589 Genetic susceptibility to other disease: Secondary | ICD-10-CM

## 2020-08-01 DIAGNOSIS — Z1501 Genetic susceptibility to malignant neoplasm of breast: Secondary | ICD-10-CM

## 2020-09-04 ENCOUNTER — Other Ambulatory Visit: Payer: Self-pay

## 2020-09-04 ENCOUNTER — Ambulatory Visit: Payer: Medicare Other | Attending: Internal Medicine

## 2020-09-04 DIAGNOSIS — Z23 Encounter for immunization: Secondary | ICD-10-CM

## 2020-09-04 NOTE — Progress Notes (Signed)
   Covid-19 Vaccination Clinic  Name:  Lindsey Kemp    MRN: [id number] DOB: 18-Jun-1950  09/04/2020  Lindsey Kemp was observed post Covid-19 immunization for 15 minutes without incident. She was provided with Vaccine Information Sheet and instruction to access the V-Safe system.   Lindsey Kemp was instructed to call 911 with any severe reactions post vaccine: Marland Kitchen Difficulty breathing  . Swelling of face and throat  . A fast heartbeat  . A bad rash all over body  . Dizziness and weakness   Immunizations Administered    Name Date Dose VIS Date Route   PFIZER Comrnaty(Gray TOP) Covid-19 Vaccine 09/04/2020 12:18 PM 0.3 mL 05/04/2020 Intramuscular   Manufacturer: [employer]   Lot: [id number]   NDC: [phone]

## 2020-09-12 ENCOUNTER — Other Ambulatory Visit (HOSPITAL_BASED_OUTPATIENT_CLINIC_OR_DEPARTMENT_OTHER): Payer: Self-pay

## 2020-09-12 ENCOUNTER — Other Ambulatory Visit: Payer: Medicare Other

## 2020-09-12 MED ORDER — COVID-19 MRNA VAC-TRIS(PFIZER) 30 MCG/0.3ML IM SUSP
INTRAMUSCULAR | 0 refills | Status: AC
  Filled 2020-09-12: qty 0.3, 1d supply, fill #0

## 2020-09-22 ENCOUNTER — Other Ambulatory Visit (HOSPITAL_BASED_OUTPATIENT_CLINIC_OR_DEPARTMENT_OTHER): Payer: Self-pay

## 2020-10-30 IMAGING — MR MRI BREAST BILATERAL W/WO CONTRAST
5 of 12 series · 16 of 48 positions shown · IV contrast (gadolinium)
Comparison: MR exam of 11/02/2019

FINAL Diagnostic Imaging Report 
________________________________________________________________________________________________ 
MRI BREAST BILATERAL W/WO CONTRAST, 10/30/2020 [DATE]: 
CLINICAL INDICATION: History of right breast cancer. No new problems. Mono 
allele mutation of X0SEY gene.
TECHNIQUE: Multiple sequences were obtained in various planes with both before 
and after the intravenous administration of gadolinium. In addition to the 
routine images, three-dimensional renderings were performed on an independent 
workstation, time activity curves generated over areas of enhancement and 
computer-aided detection utilized. 5 mL of Gadavist was injected were injected 
intravenously with the injector at a rate of 2 cc per second. Patient was 
scanned on a 3T magnet.

[Series 201: survey · axial · 7.0mm · 1.34mm/px · z∈[-6,+254]mm · 2 of 24 slices shown]
[im 1/24]
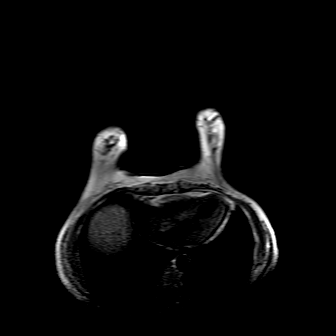
[im 24/24]
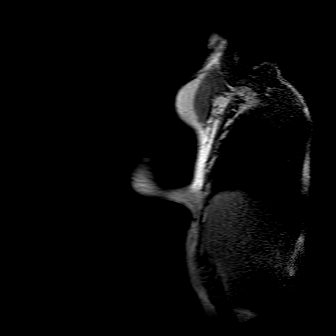

[Series 301: t1w_ffe_3d · axial · 1.6mm · 0.53mm/px · z∈[-39,+160]mm · 5 of 209 slices shown]
[im 1/209]
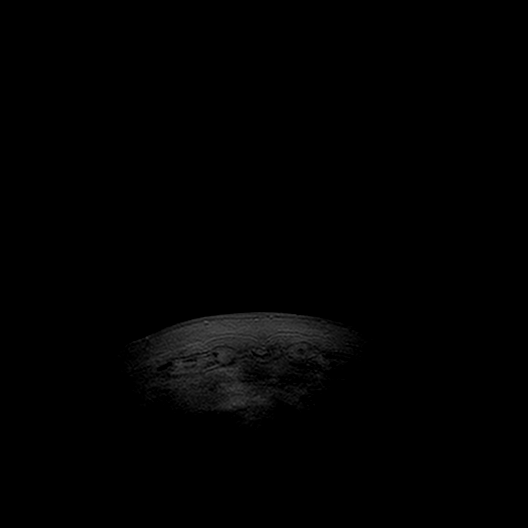
[im 53/209]
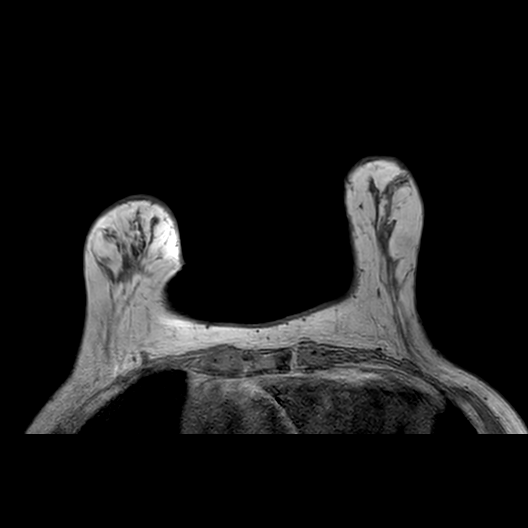
[im 105/209]
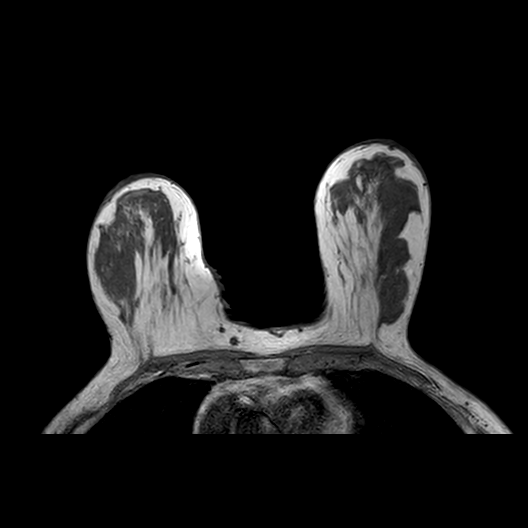
[im 157/209]
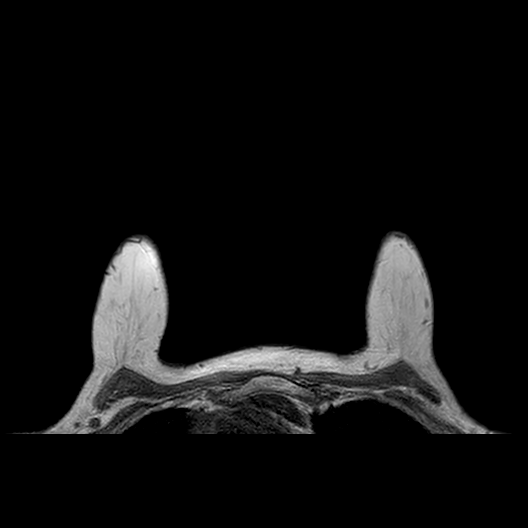
[im 209/209]
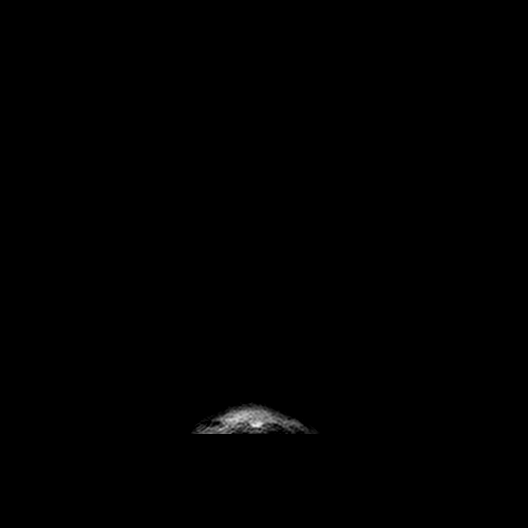

[Series 401: t2w_(id) · axial · 2.5mm · 0.53mm/px · 1 of 69 slices shown]
[im 1/69]
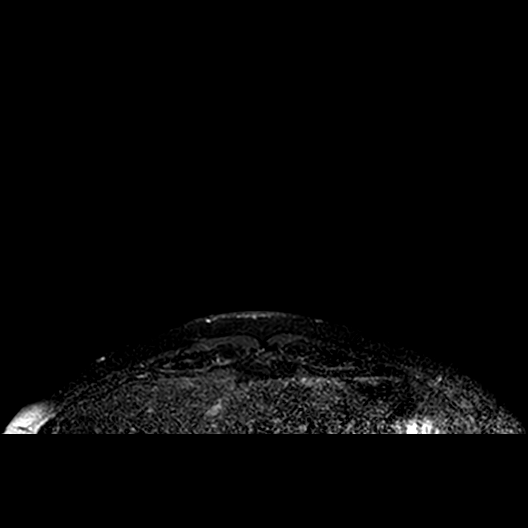

[Series 502: ssub dyn 1 · axial · 1.6mm · 0.44mm/px · z∈[-39,+160]mm · 5 of 250 slices shown]
[im 1/250]
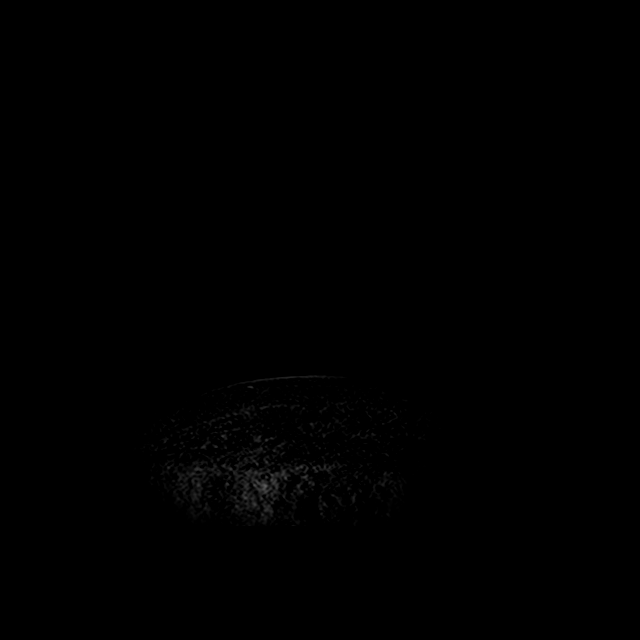
[im 63/250]
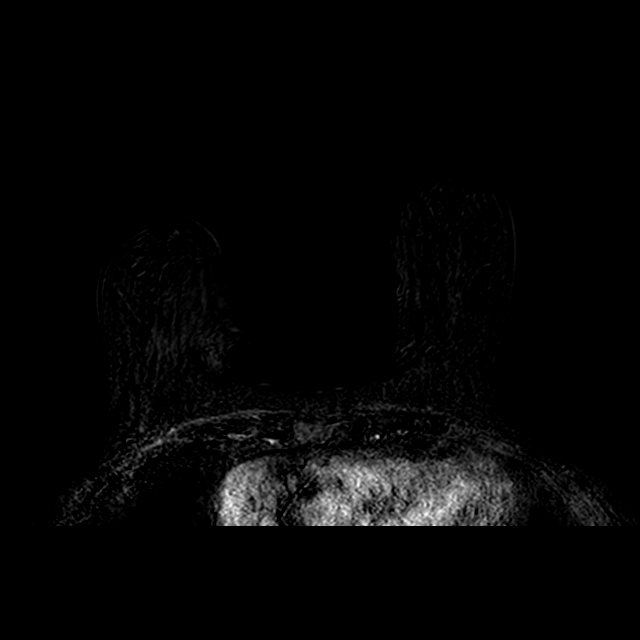
[im 125/250]
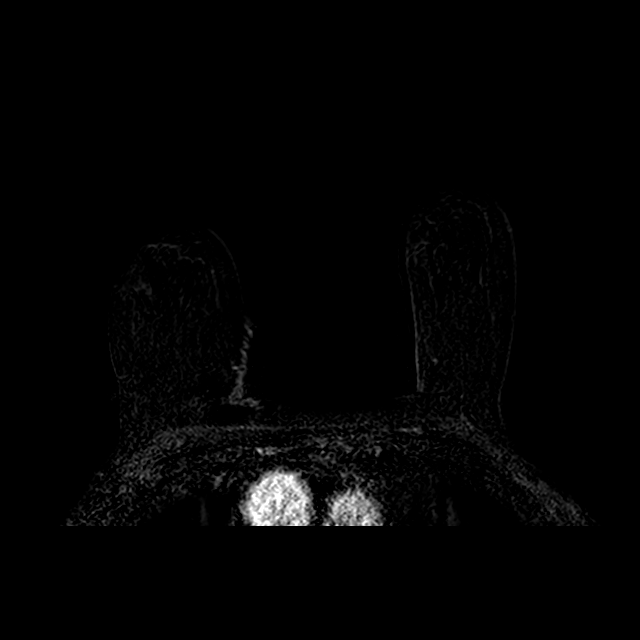
[im 187/250]
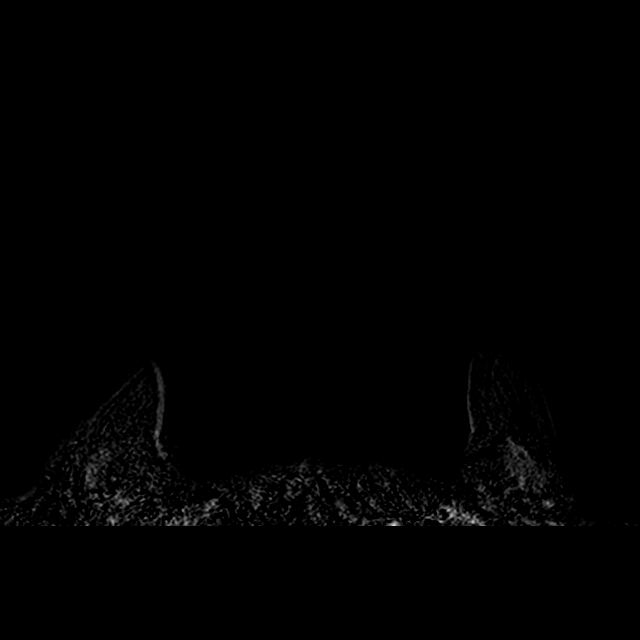
[im 250/250]
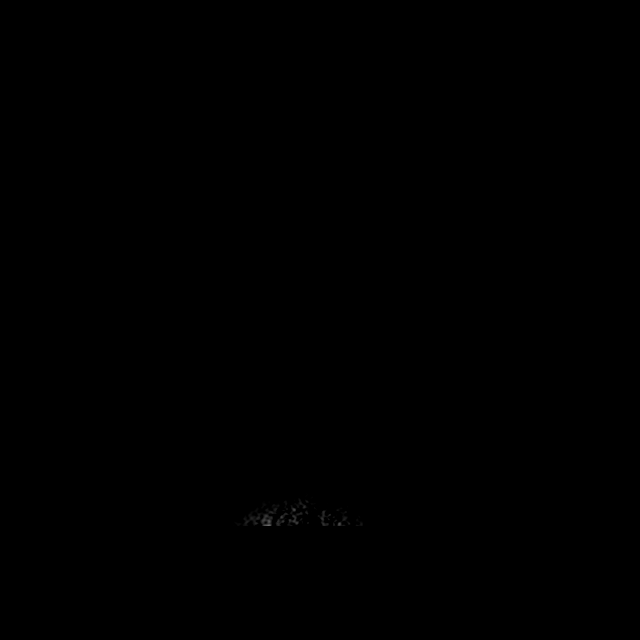

[Series 503: ssub dyn 2 · axial · 1.6mm · 0.44mm/px · z∈[-39,+60]mm · 3 of 250 slices shown]
[im 1/250]
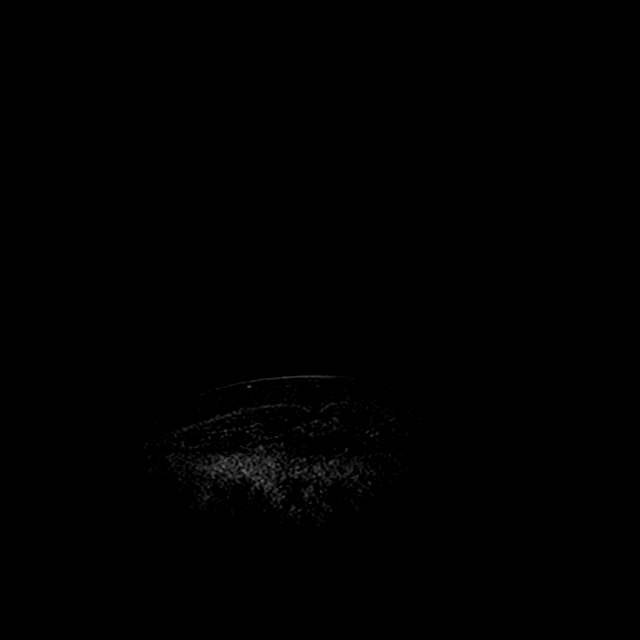
[im 63/250]
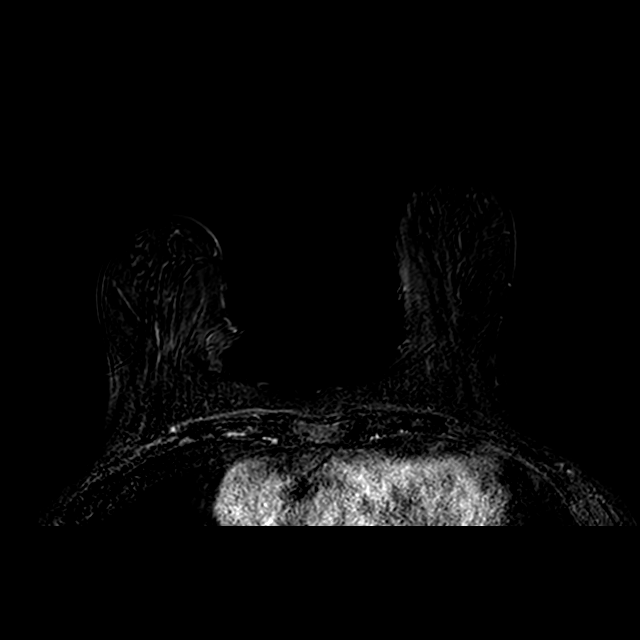
[im 125/250]
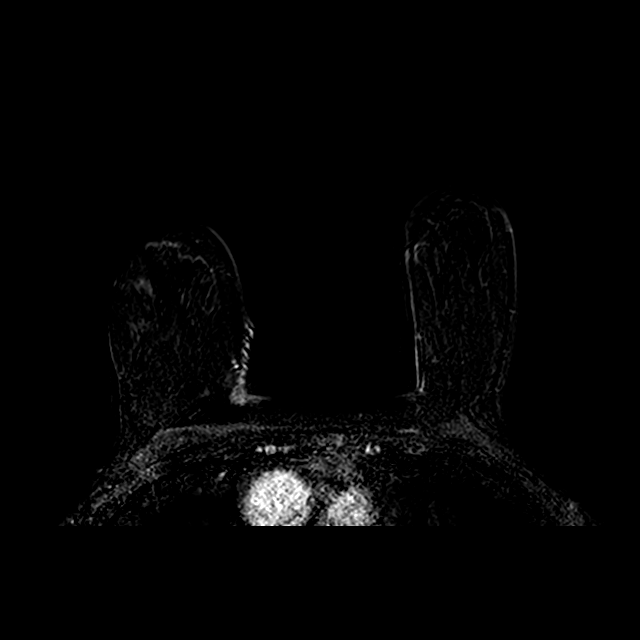

[16 of 48 positions shown; findings below may reference images not displayed]

FINDINGS: Minimal background parenchymal enhancement. 
Right breast: No abnormal areas of enhancement. No areas of enhancement meeting 
threshold criteria on CAD analysis. Small nonspecific right axillary lymph 
nodes.  No internal mammary nodes. 
Left breast: No abnormal areas of enhancement. No areas of enhancement meeting 
threshold criteria on CAD analysis. Small nonspecific left axillary lymph nodes. 
 No internal mammary nodes.
IMPRESSION: Stable MR appearance. No MR findings suggestive for malignancy. 
(BI-RADS 2) Benign findings. Routine mammographic follow-up is recommended.

## 2020-11-02 ENCOUNTER — Encounter: Payer: Self-pay | Admitting: Hematology and Oncology

## 2021-05-10 IMAGING — MG MAMMOGRAPHY SCREENING BILATERAL 3[PERSON_NAME]
8 series · 8 of 24 positions shown · non-contrast
Comparison: Comparison was made to prior examinations.

________________________________________________________________________________________________ 
MAMMOGRAPHY SCREENING BILATERAL 3LORRAINE JIM, 05/10/2021 [DATE]: 
CLINICAL INDICATION: History of right breast carcinoma 5322.
TECHNIQUE: Digital bilateral mammograms and 3-D Tomosynthesis were obtained. 
These were interpreted both primarily and with the aid of computer-aided 
detection system.  
BREAST DENSITY: (Level D) The breasts are extremely dense, which lowers the 
sensitivity of mammography.

[R MLO]
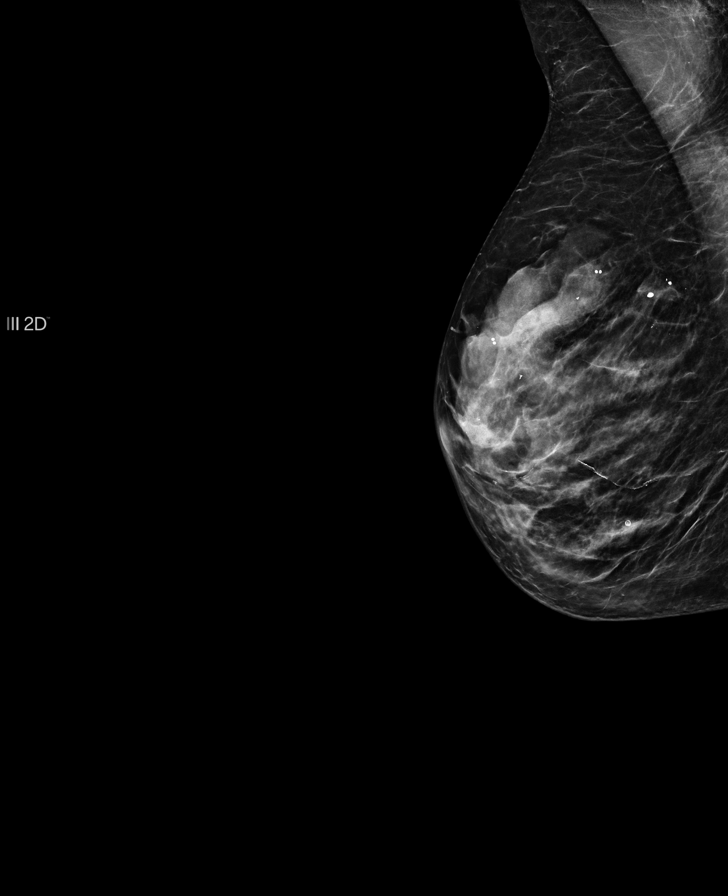

[R CC]
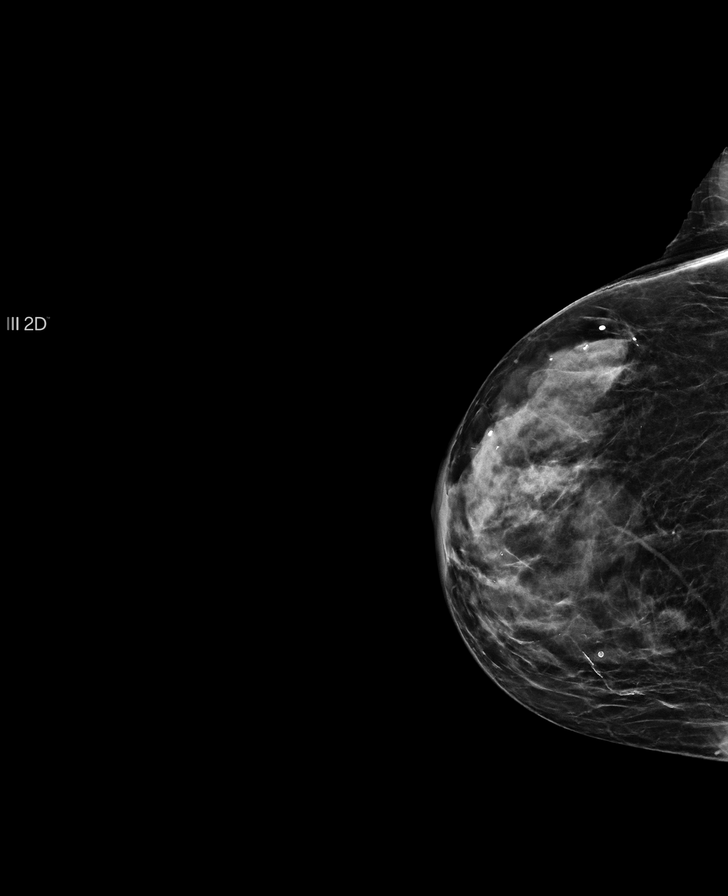

[L CC]
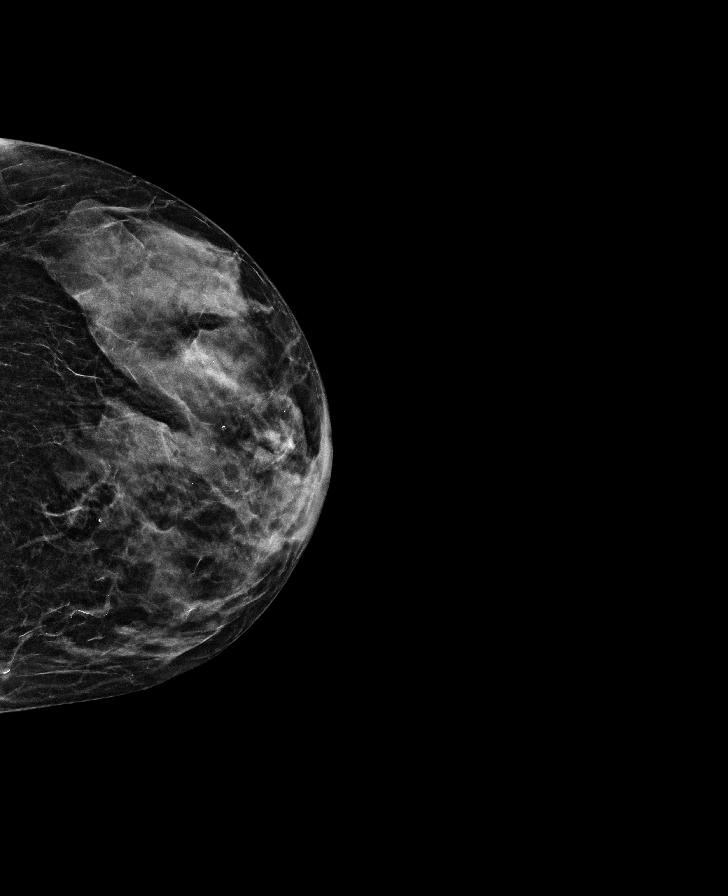

[L MLO]
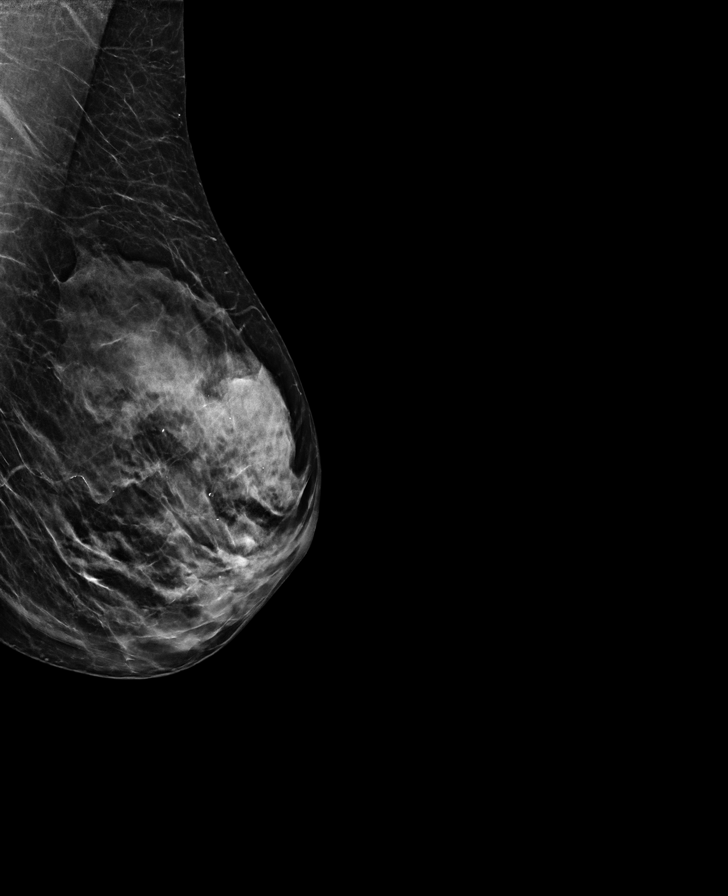

[L MLO tomo · tomo slice 26/51.0]
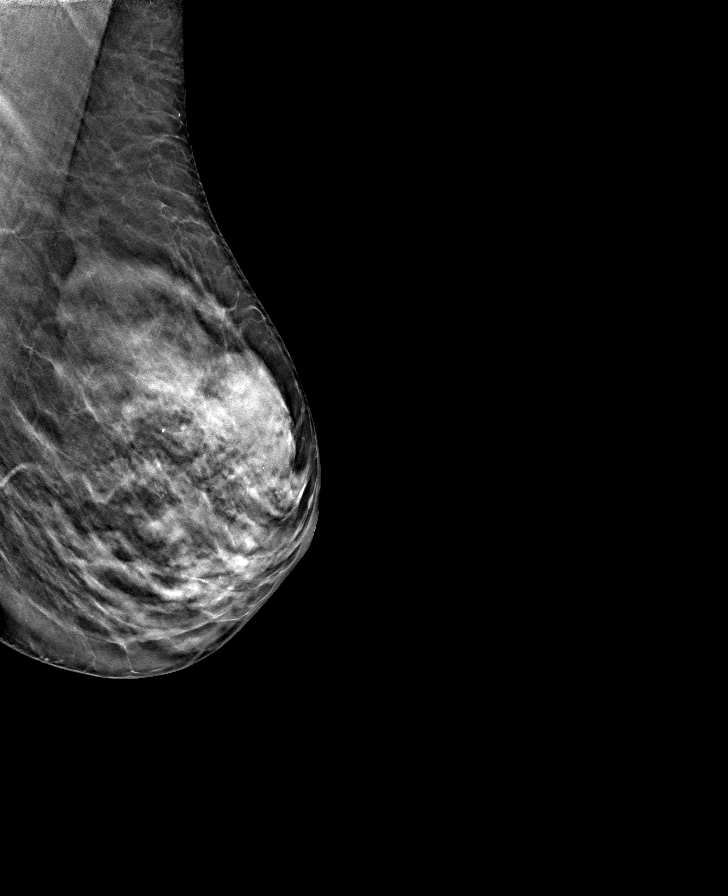

[R CC tomo · tomo slice 28/55.0]
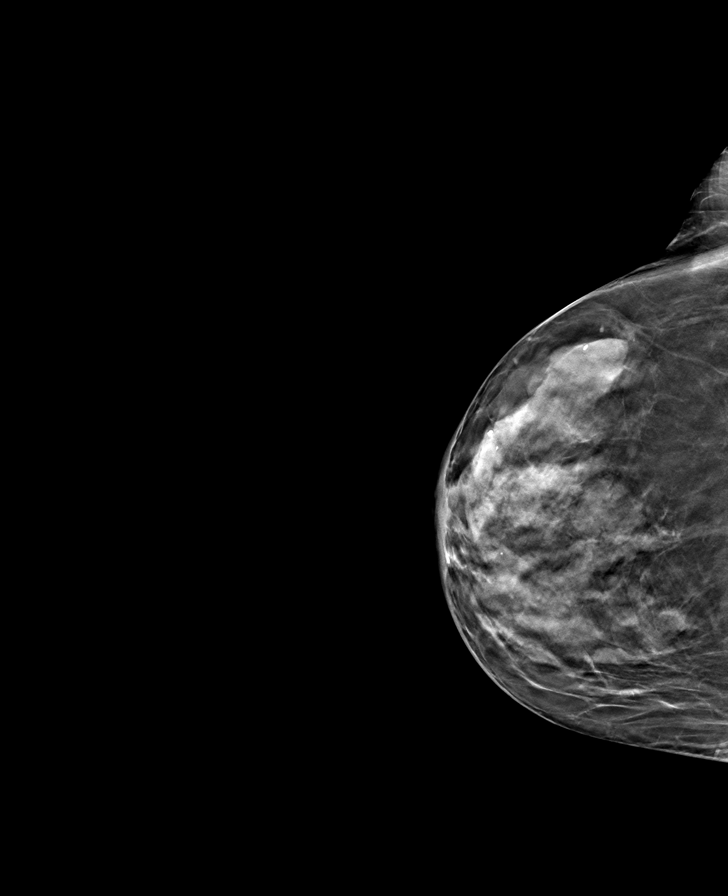

[L CC tomo · tomo slice 23/46.0]
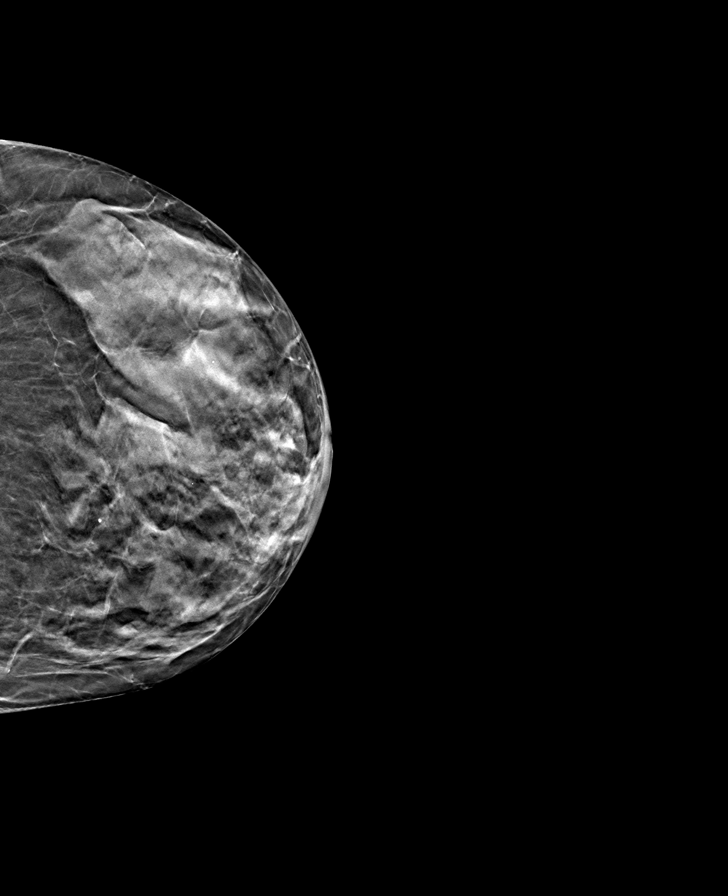

[R MLO tomo · tomo slice 27/54.0]
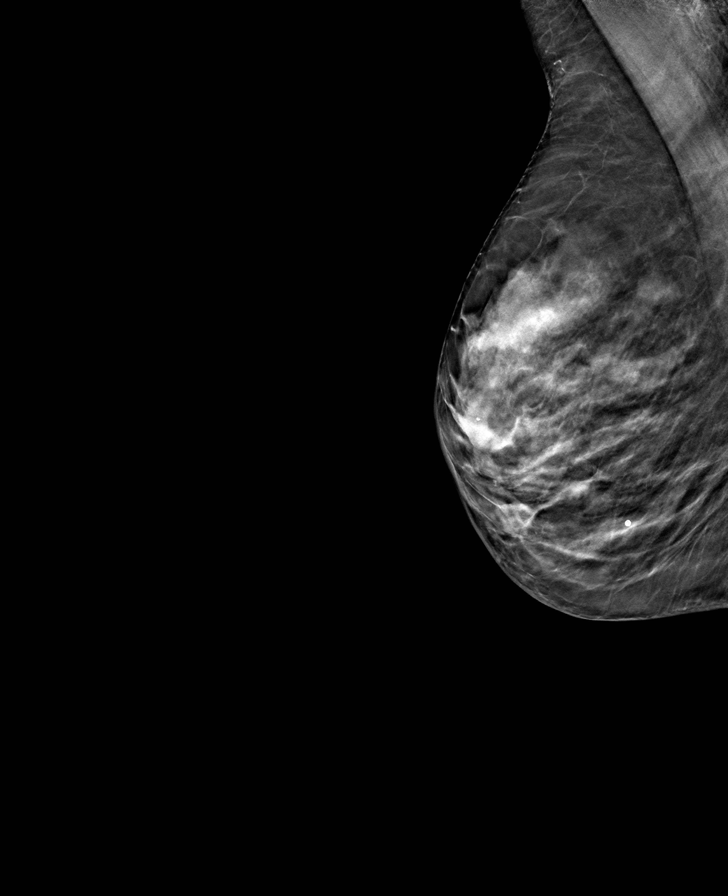

[8 of 24 positions shown; findings below may reference images not displayed]

FINDINGS: Posttreatment changes seen within the right breast. Few scattered 
benign calcifications. No new or suspicious abnormality seen.
IMPRESSION: Stable exam. 
(BI-RADS 2) Benign findings. Routine mammographic follow-up is recommended.

## 2021-10-31 IMAGING — MR MRI BREAST BILATERAL W/WO CONTRAST
7 of 14 series · 16 of 48 positions shown · IV contrast (gadolinium)
Comparison: Breast MRI October 30, 2020.

________________________________________________________________________________________________ 
MRI BREAST BILATERAL W/WO CONTRAST, 10/31/2021 [DATE]: 
CLINICAL INDICATION: History of RIGHT breast cancer 8144. Increased risk for 
breast malignancy.
TECHNIQUE: Multiple sequences were obtained in various planes with both before 
and after the intravenous administration of gadolinium. In addition to the 
routine images, three-dimensional renderings were performed on an independent 
workstation, time activity curves generated over areas of enhancement and 
computer-aided detection utilized.  5.5 mL of Gadavist were injected 
intravenously. 2.0 mL of Gadavist was discarded. Patient was scanned on a 3T 
magnet. .

[Series 103: patient aligned mpr · axial · 12.0mm · 3.40mm/px · 1 of 4 slices shown]
[im 1/4]
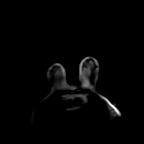

[Series 201: survey · axial · 7.0mm · 1.34mm/px · 1 of 25 slices shown]
[im 1/25]
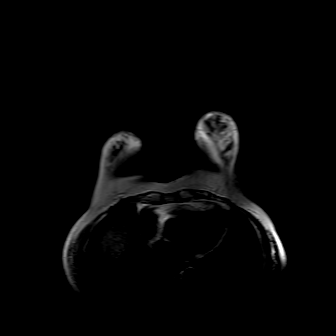

[Series 301: t1w_ffe_3d cs · axial · 1.6mm · 0.54mm/px · z∈[-83,+115]mm · 4 of 236 slices shown]
[im 1/236]
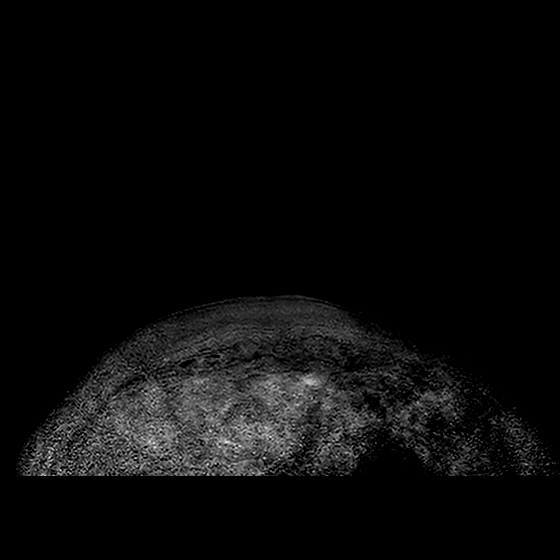
[im 79/236]
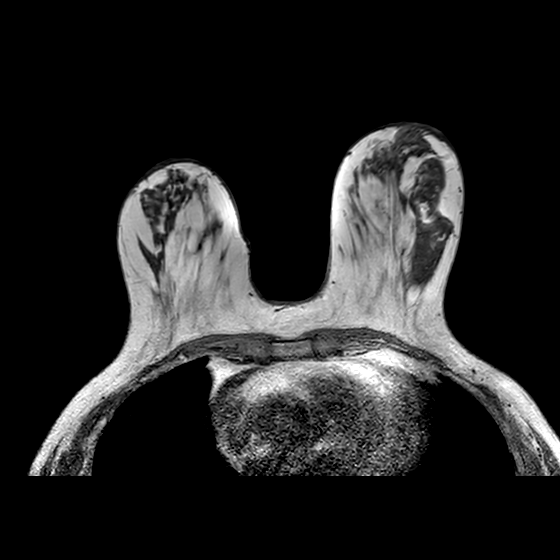
[im 157/236]
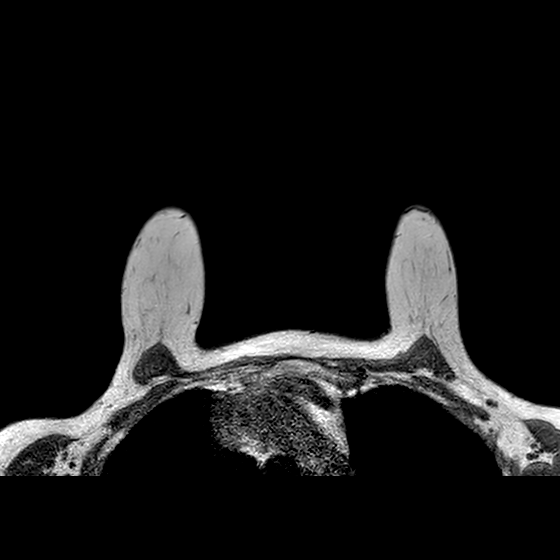
[im 236/236]
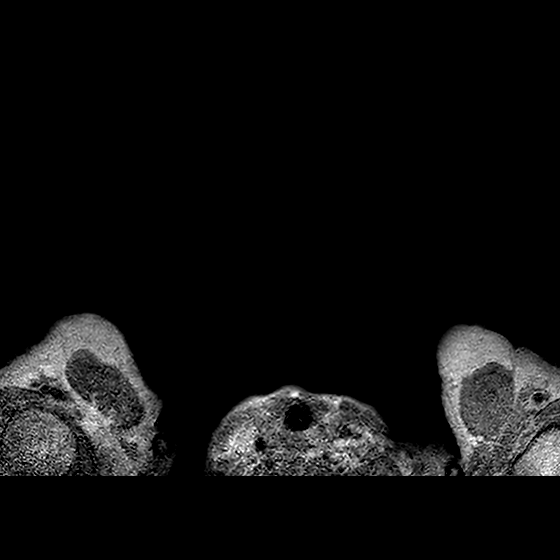

[Series 401: t2w_(id) · axial · 2.5mm · 0.62mm/px · 1 of 80 slices shown]
[im 1/80]
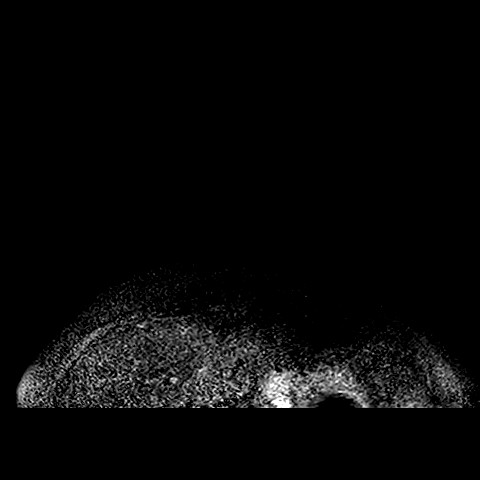

[Series 502: ssub dyn 1 · axial · 1.6mm · 0.47mm/px · z∈[-83,+116]mm · 4 of 250 slices shown]
[im 1/250]
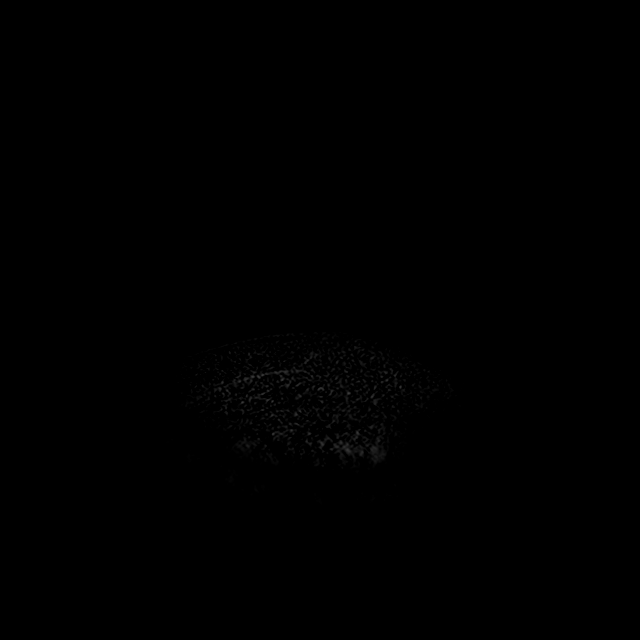
[im 84/250]
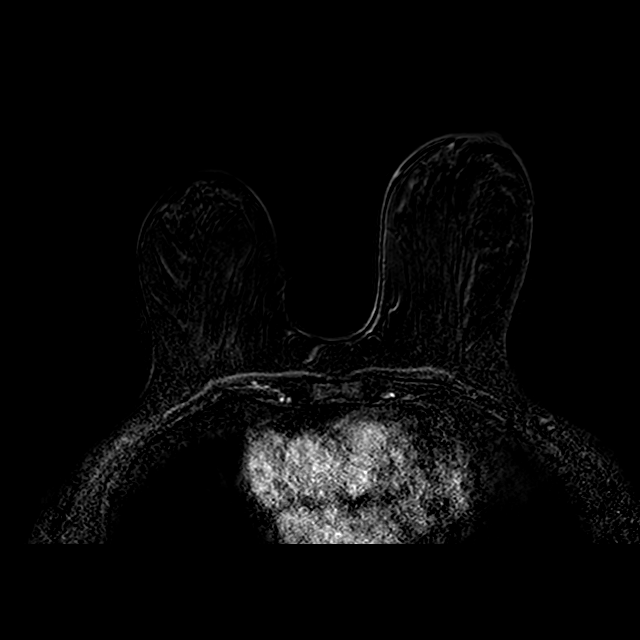
[im 167/250]
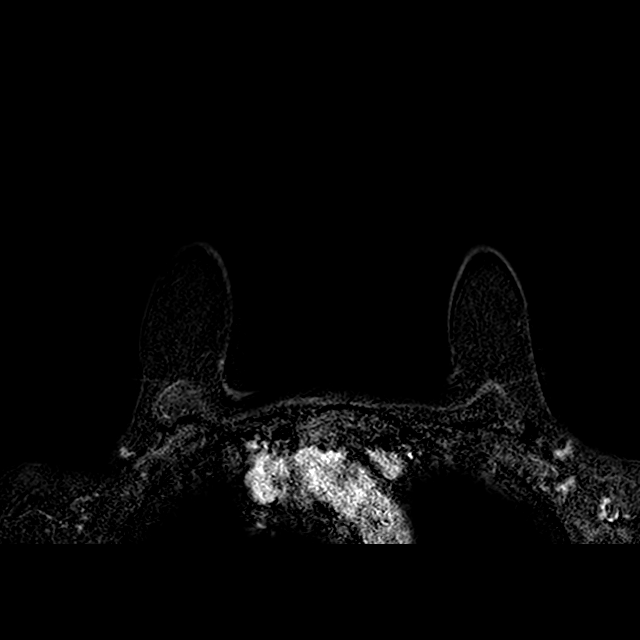
[im 250/250]
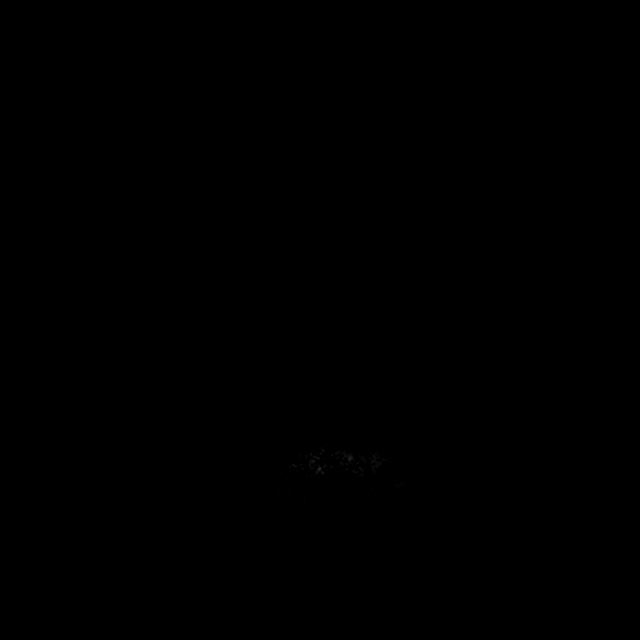

[Series 503: ssub dyn 2 · axial · 1.6mm · 0.47mm/px · z∈[-83,+116]mm · 4 of 250 slices shown]
[im 1/250]
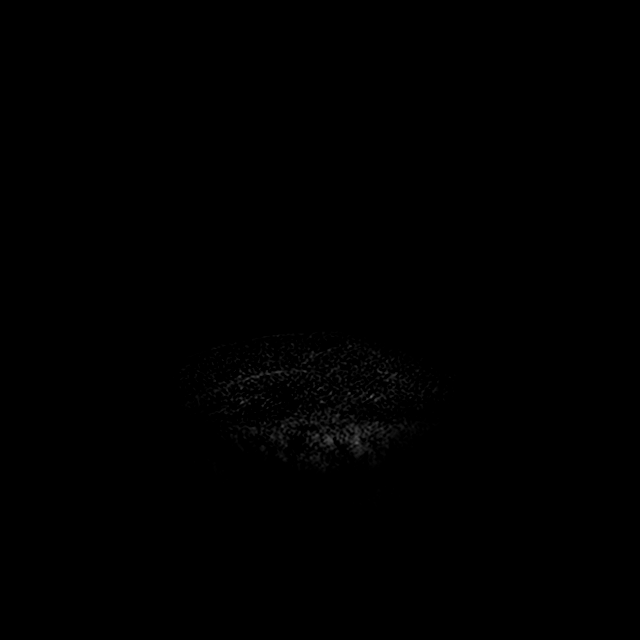
[im 84/250]
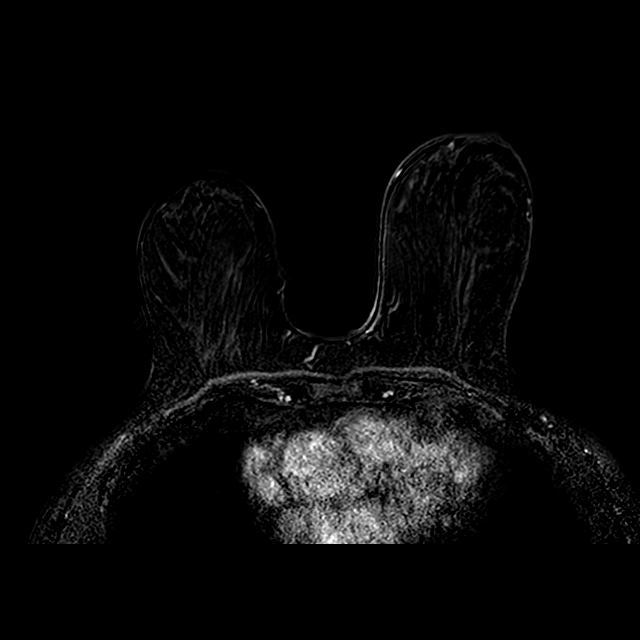
[im 167/250]
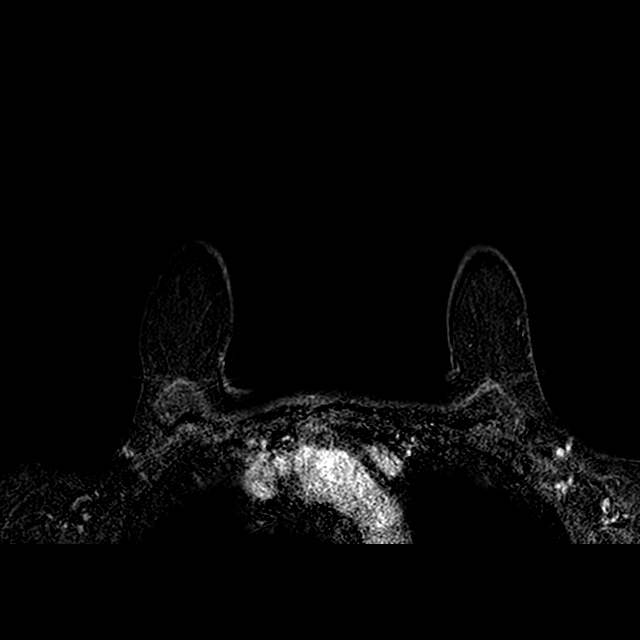
[im 250/250]
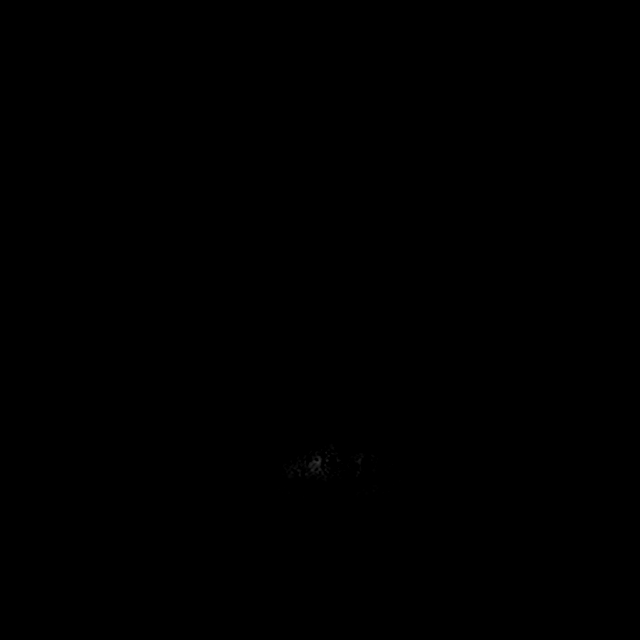

[Series 504: ssub dyn 3 · axial · 1.6mm · 0.47mm/px · 1 of 250 slices shown]
[im 1/250]
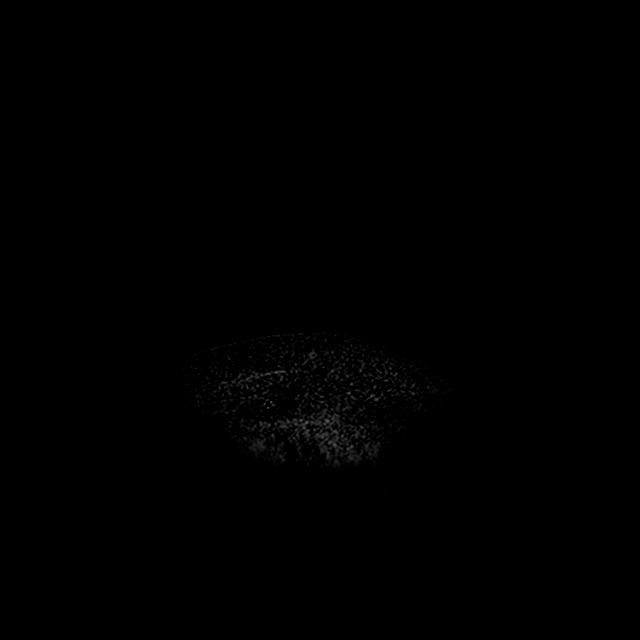

[16 of 48 positions shown; findings below may reference images not displayed]

FINDINGS: Right breast: There is minimal physiologic enhancement. No mass, kinetically 
suspicious enhancement or architectural distortion is found. Partly imaged RIGHT 
axilla is unremarkable. 
Left breast: 
There is minimal physiologic enhancement. No mass, kinetically suspicious focal 
enhancement or architectural distortion is found. The partly visible LEFT axilla 
is unremarkable.
IMPRESSION: No MRI evidence for breast malignancy. 
(BI-RADS 2) Benign findings. Routine mammographic follow-up is recommended.

## 2022-05-13 IMAGING — MG MAMMOGRAPHY SCREENING BILATERAL 3[PERSON_NAME]
8 series · 8 of 24 positions shown · non-contrast
Comparison: Comparison was made to prior examinations.

________________________________________________________________________________________________ 
MAMMOGRAPHY SCREENING BILATERAL 3BLAIN JUMPER, 05/13/2022 [DATE]: 
CLINICAL INDICATION: History of right breast carcinoma 3888
TECHNIQUE: Digital bilateral mammograms and 3-D Tomosynthesis were obtained. 
These were interpreted both primarily and with the aid of computer-aided 
detection system.  
BREAST DENSITY: (Level D) The breasts are extremely dense, which lowers the 
sensitivity of mammography.

[R CC]
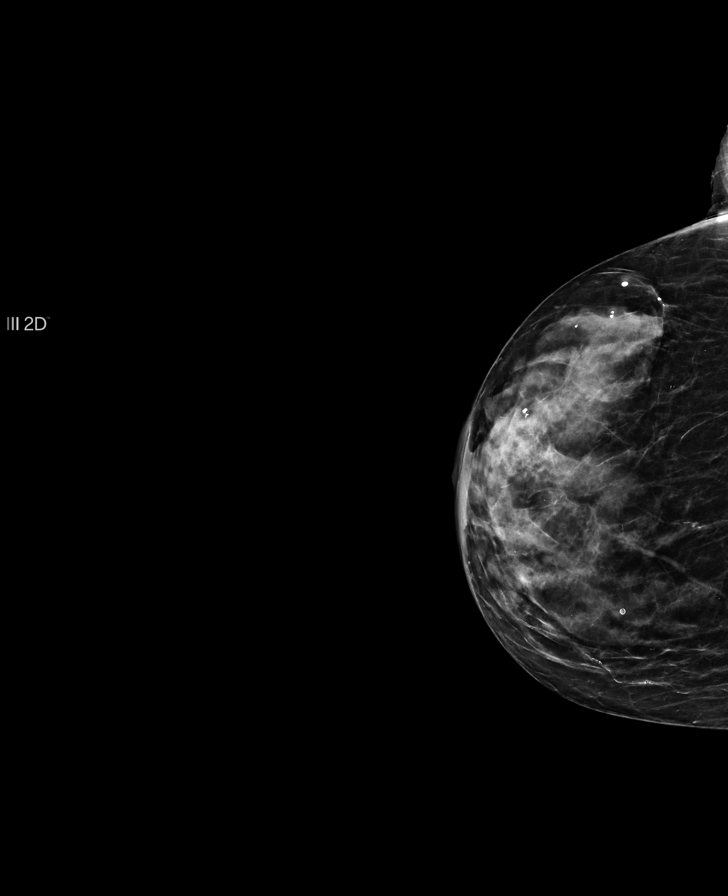

[L MLO]
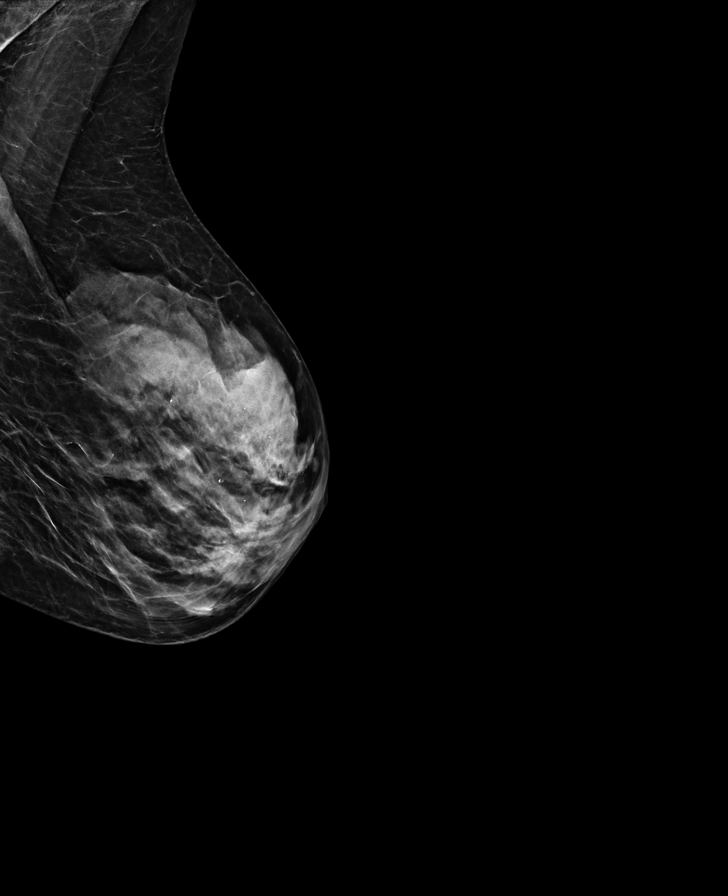

[R MLO]
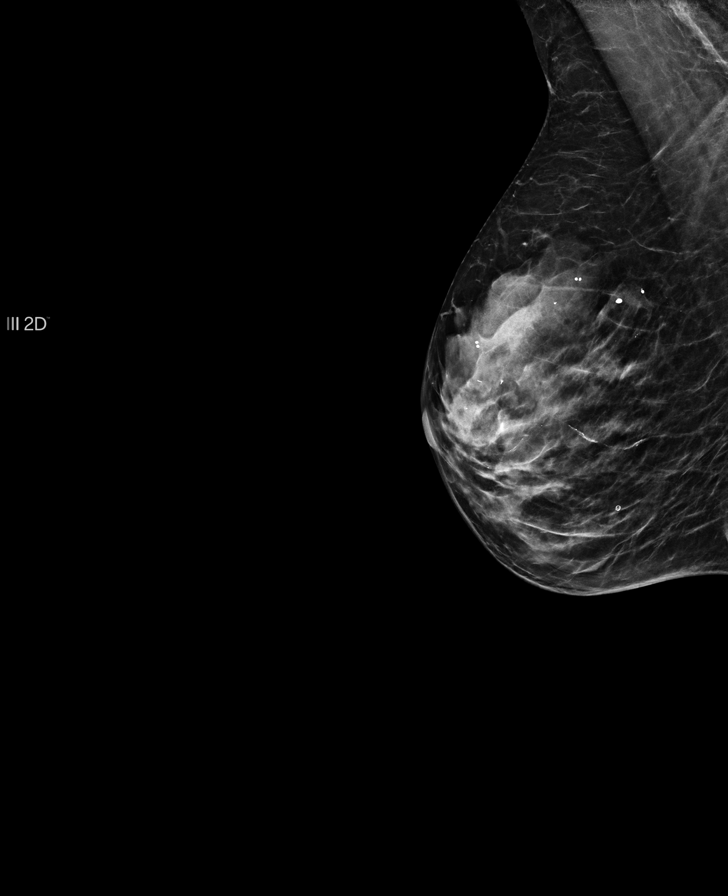

[L CC]
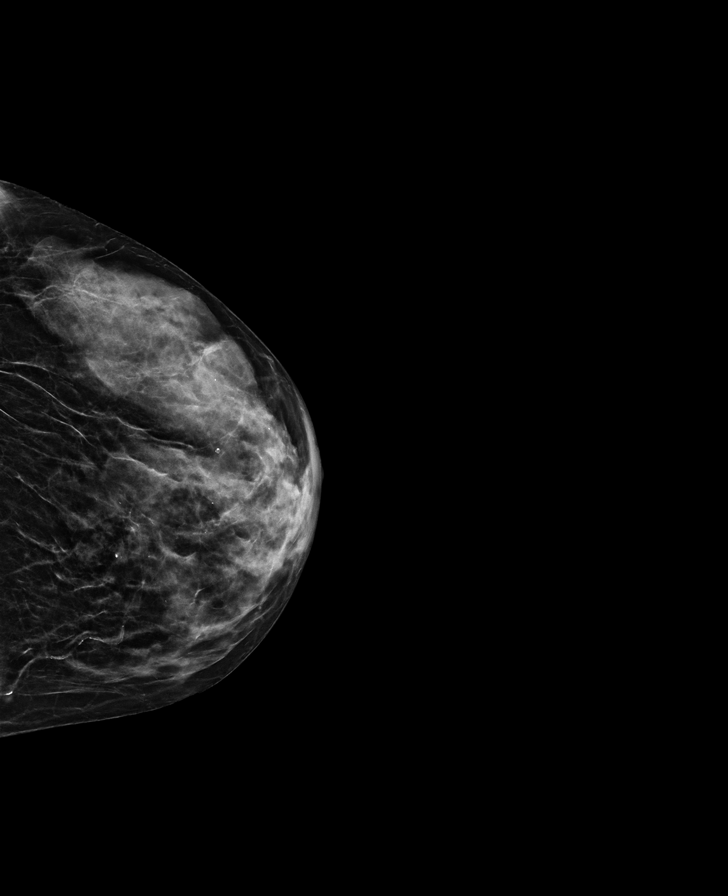

[R CC tomo · tomo slice 28/55.0]
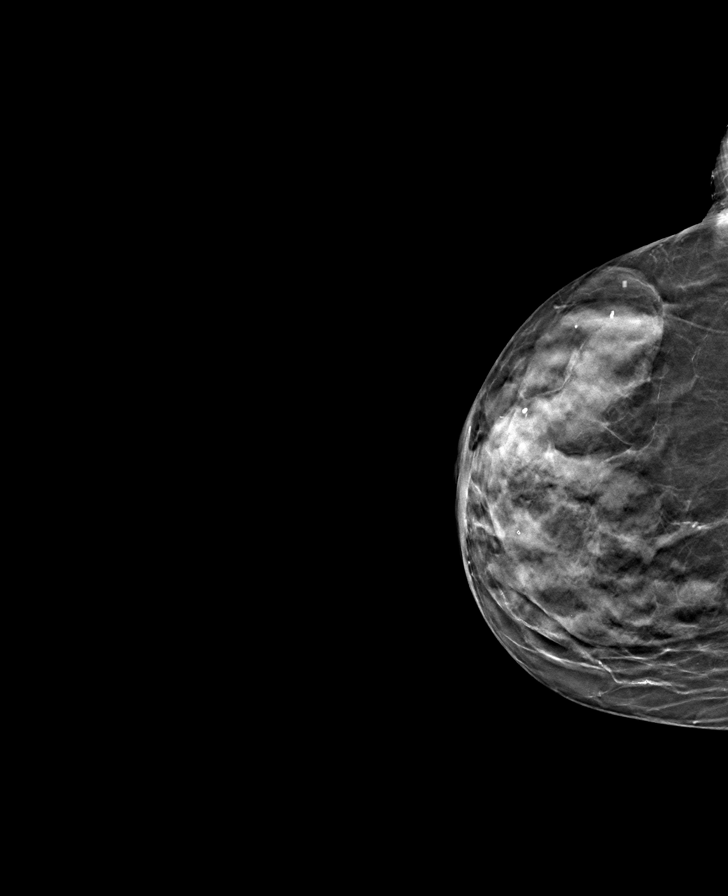

[R MLO tomo · tomo slice 29/58.0]
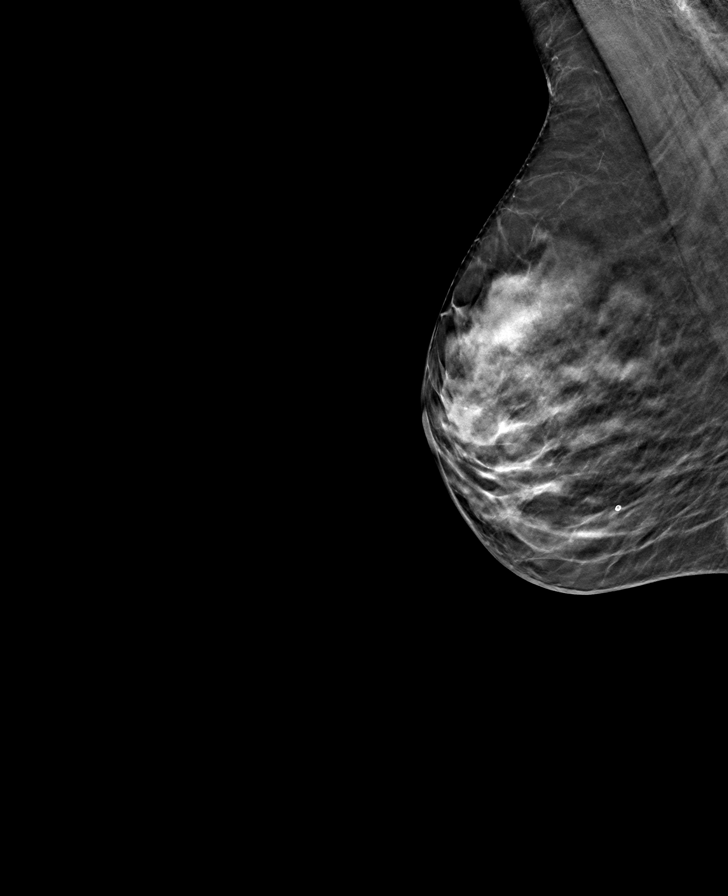

[L CC tomo · tomo slice 25/50.0]
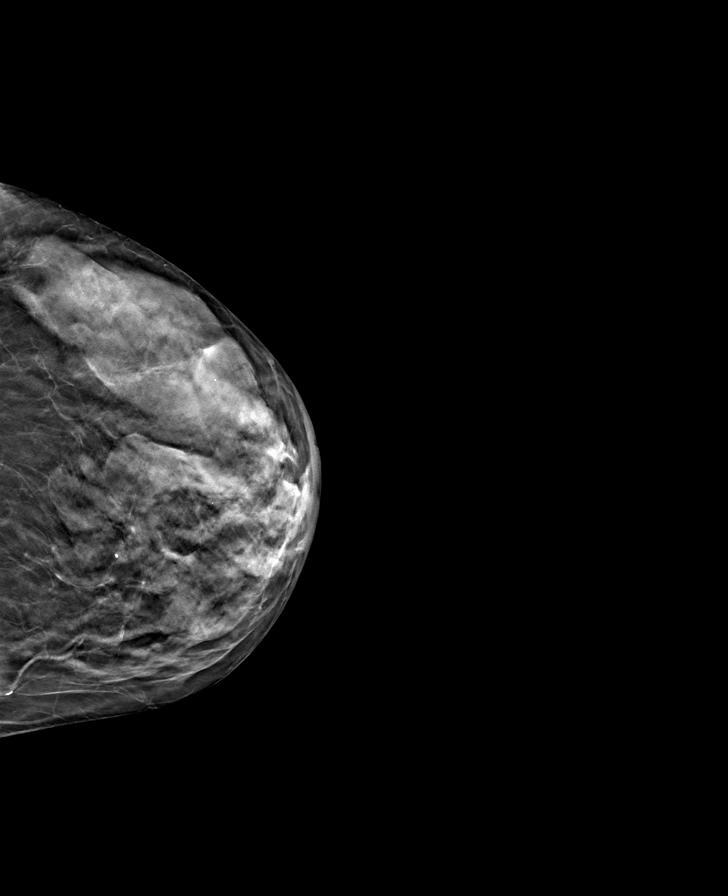

[L MLO tomo · tomo slice 29/57.0]
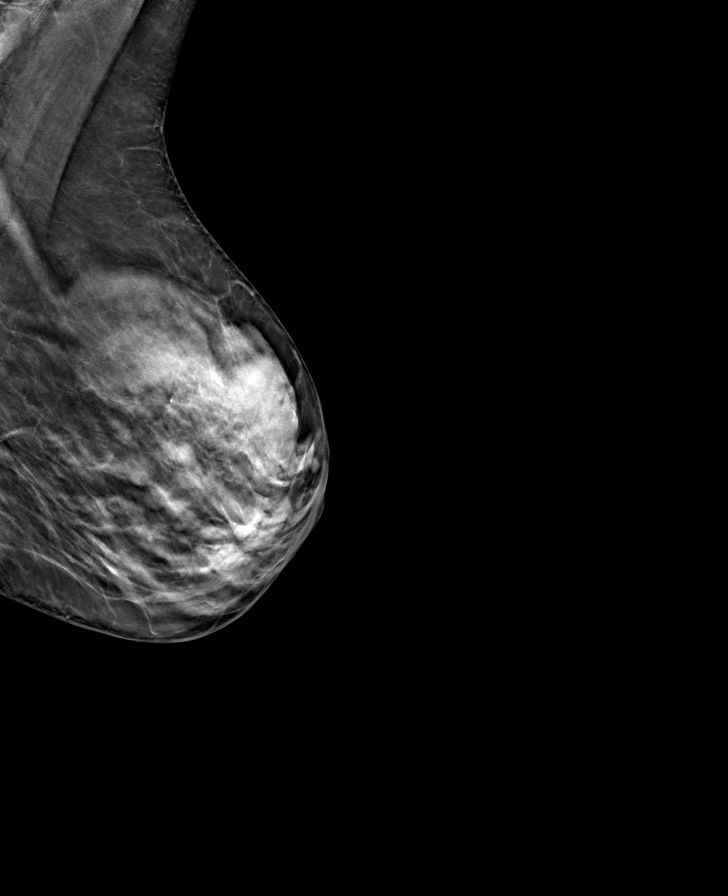

[8 of 24 positions shown; findings below may reference images not displayed]

FINDINGS: No suspicious mass, calcifications, or area of architectural 
distortion in either breast.
IMPRESSION: Stable exam. 
(BI-RADS 2) Benign findings. Routine mammographic follow-up is recommended.

## 2022-11-01 IMAGING — MR MRI BREAST BILATERAL W/WO CONTRAST
5 of 12 series · 15 of 48 positions shown · IV contrast (gadolinium)
Comparison: MRI from October 31, 2021

________________________________________________________________________________________________ 
MRI BREAST BILATERAL W/WO CONTRAST, 11/01/2022 [DATE]: 
CLINICAL INDICATION: History of right breast carcinoma. Follow-up.
TECHNIQUE: Multiple sequences were obtained in various planes with both before 
and after the intravenous administration of gadolinium. In addition to the 
routine images, three-dimensional renderings were performed on an independent 
workstation, time activity curves generated over areas of enhancement and 
computer-aided detection utilized. 5.5 mL of Gadavist were injected 
intravenously. 2 mL was discarded. Patient was scanned on a 3T magnet.

[Series 201: survey · axial · 7.0mm · 1.34mm/px · z∈[-12,+250]mm · 2 of 23 slices shown]
[im 1/23]
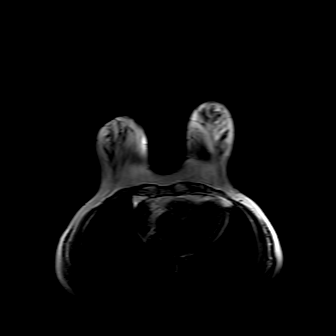
[im 23/23]
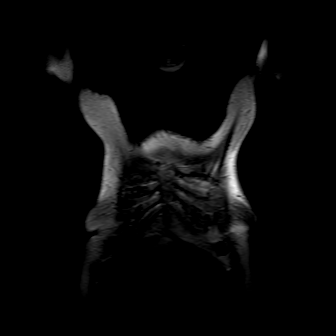

[Series 301: t1w_ffe_3d cs · axial · 1.6mm · 0.44mm/px · z∈[-51,+149]mm · 4 of 183 slices shown]
[im 1/183]
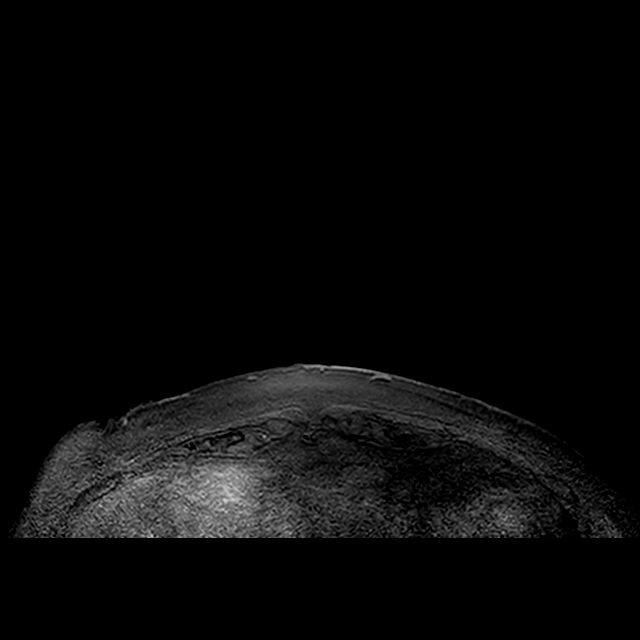
[im 61/183]
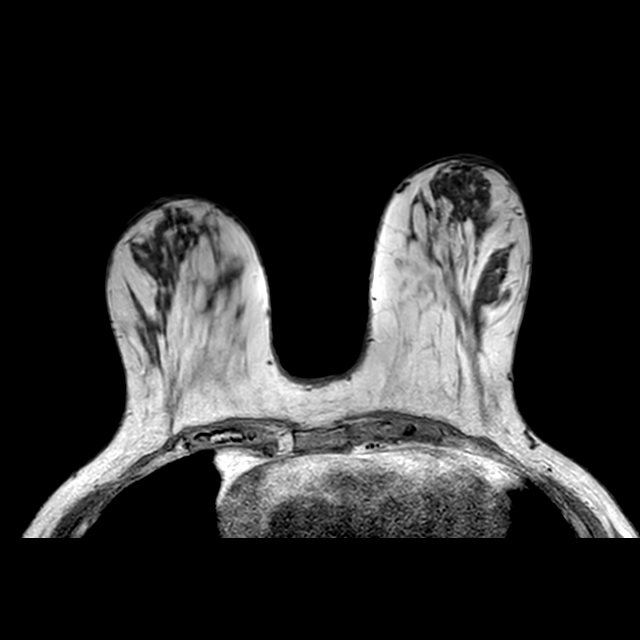
[im 122/183]
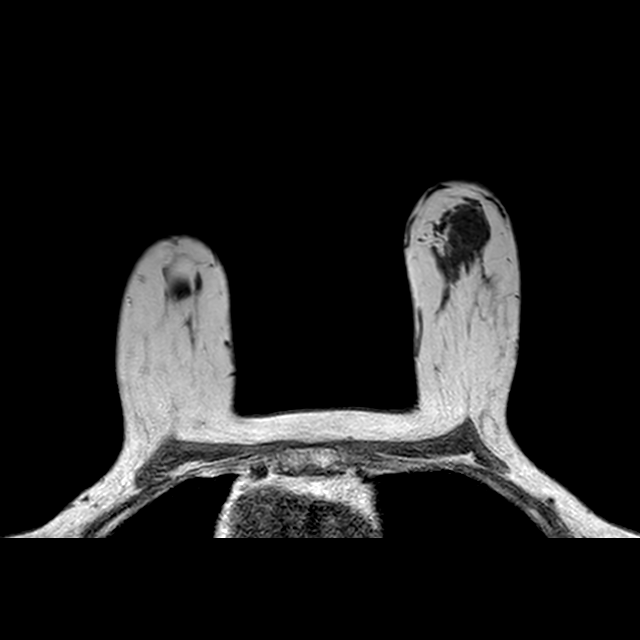
[im 183/183]
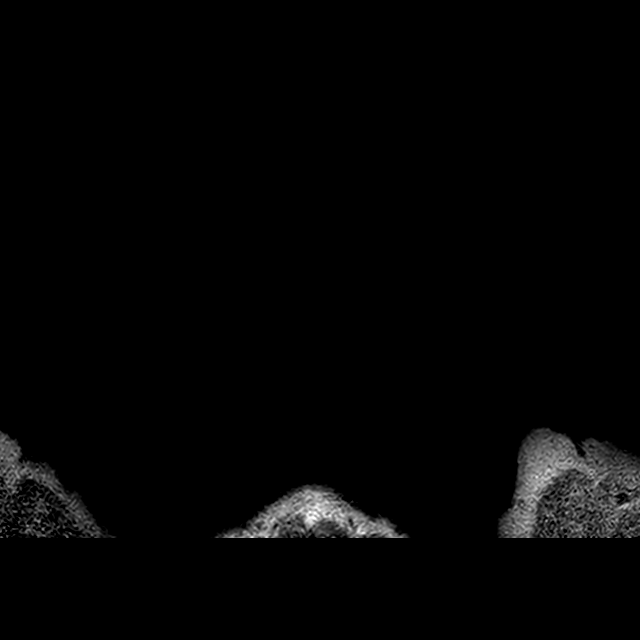

[Series 401: t2w_(id) · axial · 2.5mm · 0.55mm/px · z∈[-50,+148]mm · 2 of 73 slices shown]
[im 1/73]
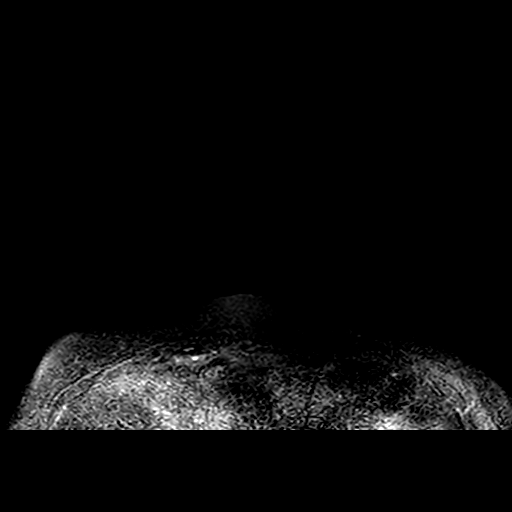
[im 73/73]
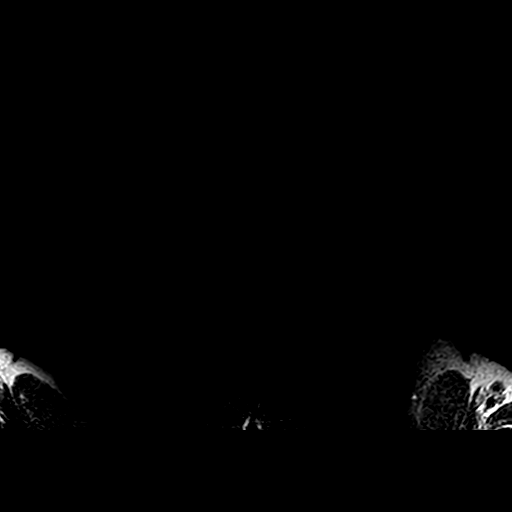

[Series 502: ssub dyn 1 · axial · 1.6mm · 0.44mm/px · z∈[-51,+149]mm · 5 of 250 slices shown]
[im 1/250]
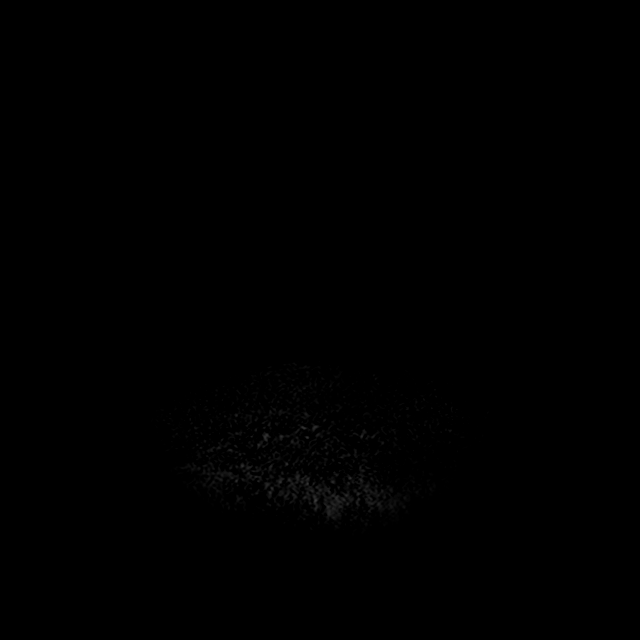
[im 63/250]
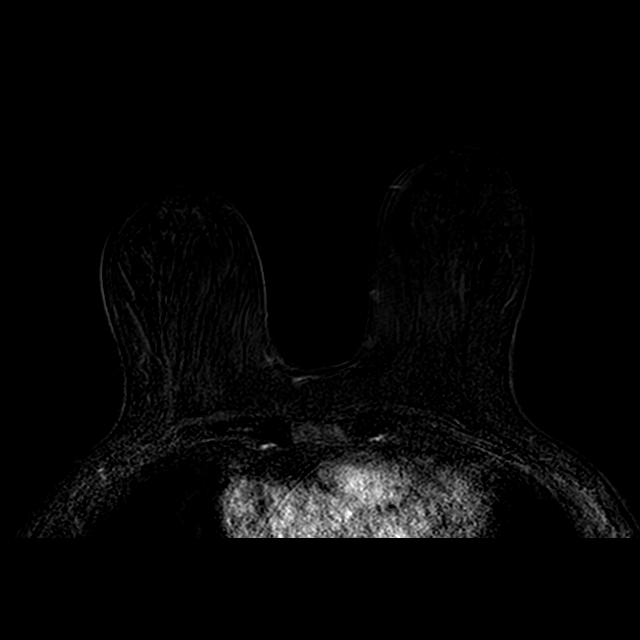
[im 125/250]
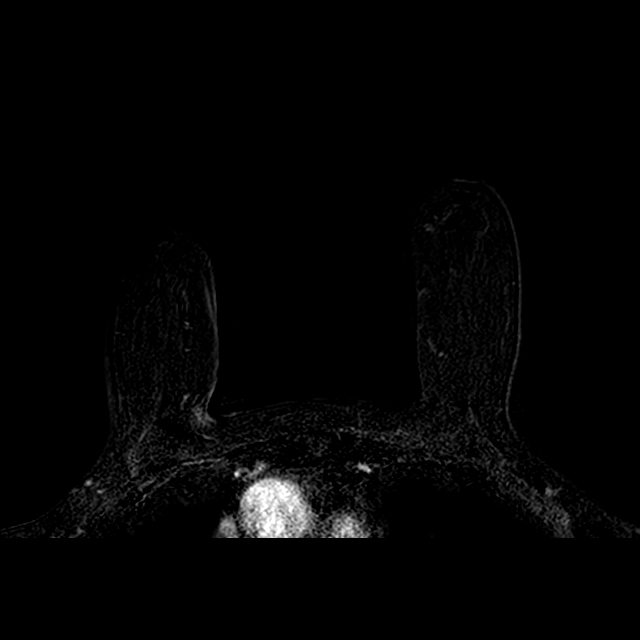
[im 187/250]
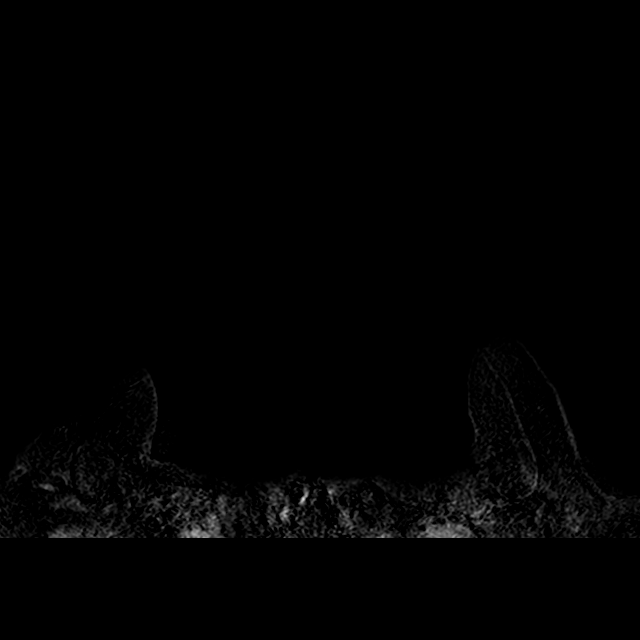
[im 250/250]
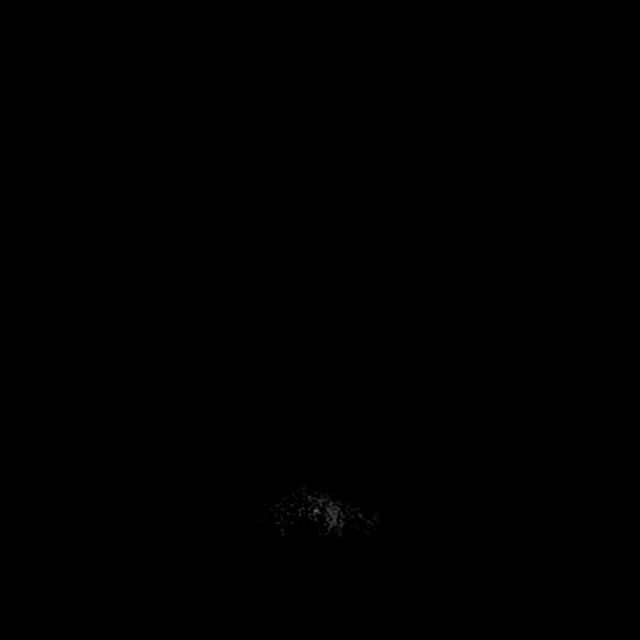

[Series 503: ssub dyn 2 · axial · 1.6mm · 0.44mm/px · z∈[-51,-1]mm · 2 of 250 slices shown]
[im 1/250]
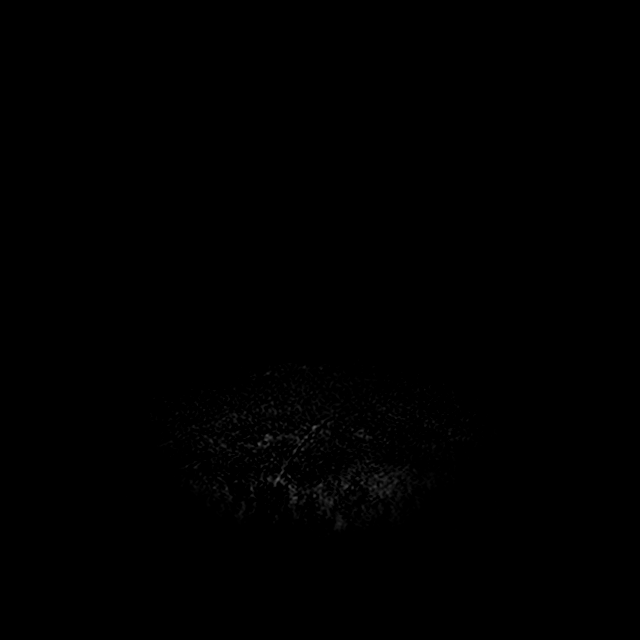
[im 63/250]
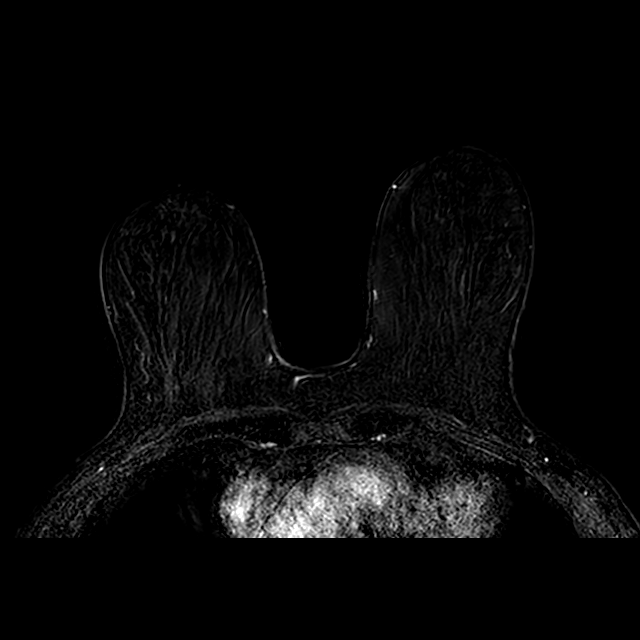

[15 of 48 positions shown; findings below may reference images not displayed]

FINDINGS: Minimal background parenchymal enhancement. 
Right breast: No abnormal areas of enhancement. No areas of enhancement meeting 
threshold criteria on CAD analysis. Small nonspecific right axillary lymph 
nodes.  No internal mammary nodes. 
Left breast: No abnormal areas of enhancement. No areas of enhancement meeting 
threshold criteria on CAD analysis. Small nonspecific left axillary lymph nodes. 
 No internal mammary nodes.
IMPRESSION: Stable exam. 
(BI-RADS 2) Benign findings. Routine mammographic follow-up is recommended.

## 2023-05-15 IMAGING — MG MAMMOGRAPHY SCREENING BILATERAL 3[PERSON_NAME]
8 series · 8 of 24 positions shown · non-contrast
Comparison: Comparison was made to prior examinations.

________________________________________________________________________________________________ 
MAMMOGRAPHY SCREENING BILATERAL 3HYKMETE ISLEYEN, 05/15/2023 [DATE]: 
CLINICAL INDICATION: Encounter for screening mammogram.
TECHNIQUE: Digital bilateral mammograms and 3-D Tomosynthesis were obtained. 
These were interpreted both primarily and with the aid of computer-aided 
detection system.  
BREAST DENSITY: (Level C) The breasts are heterogeneously dense, which may 
obscure small masses.

[R CC]
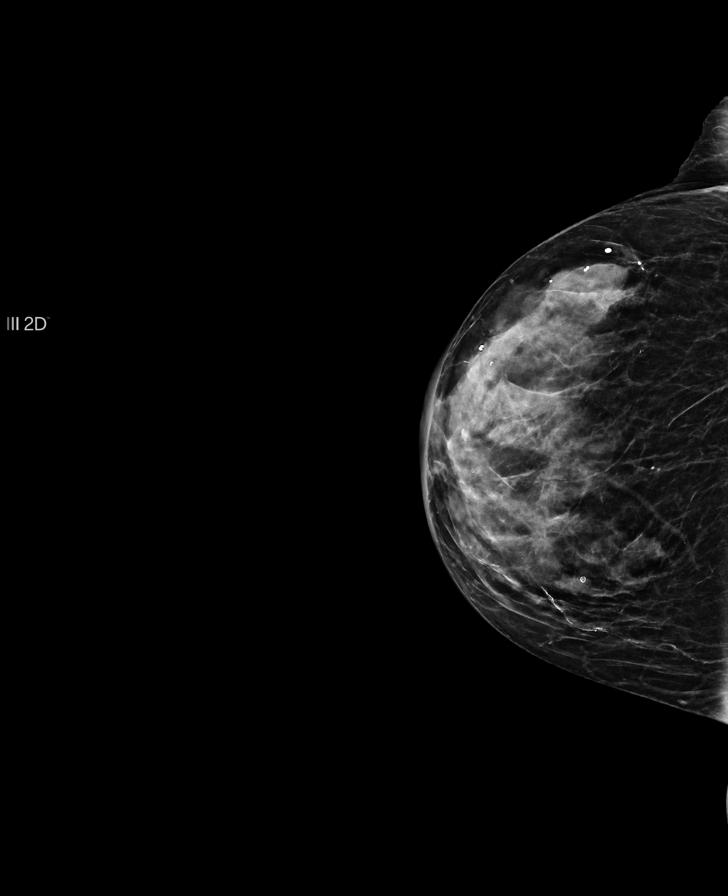

[L CC]
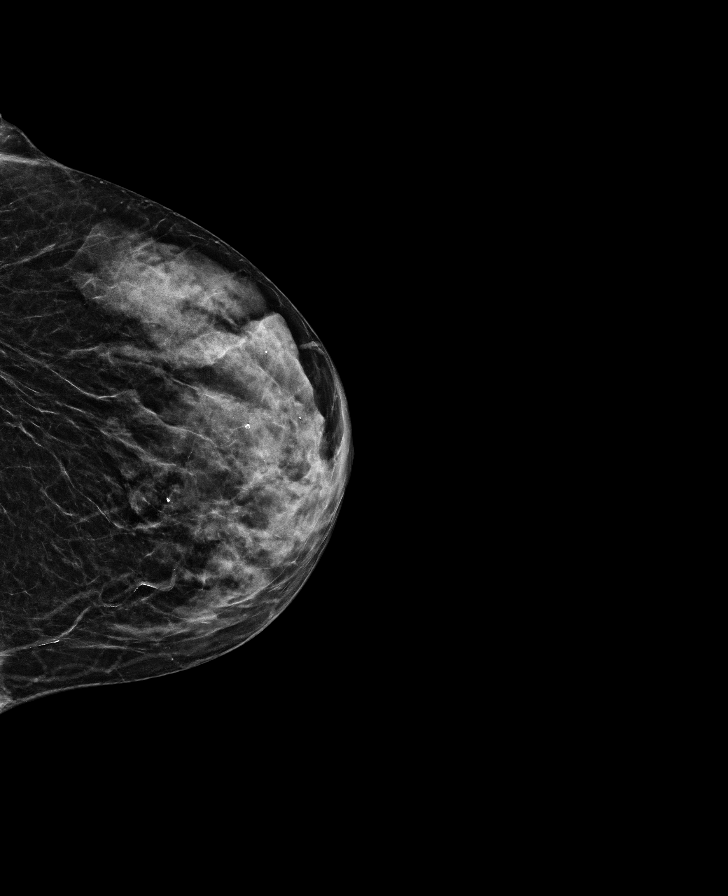

[L MLO]
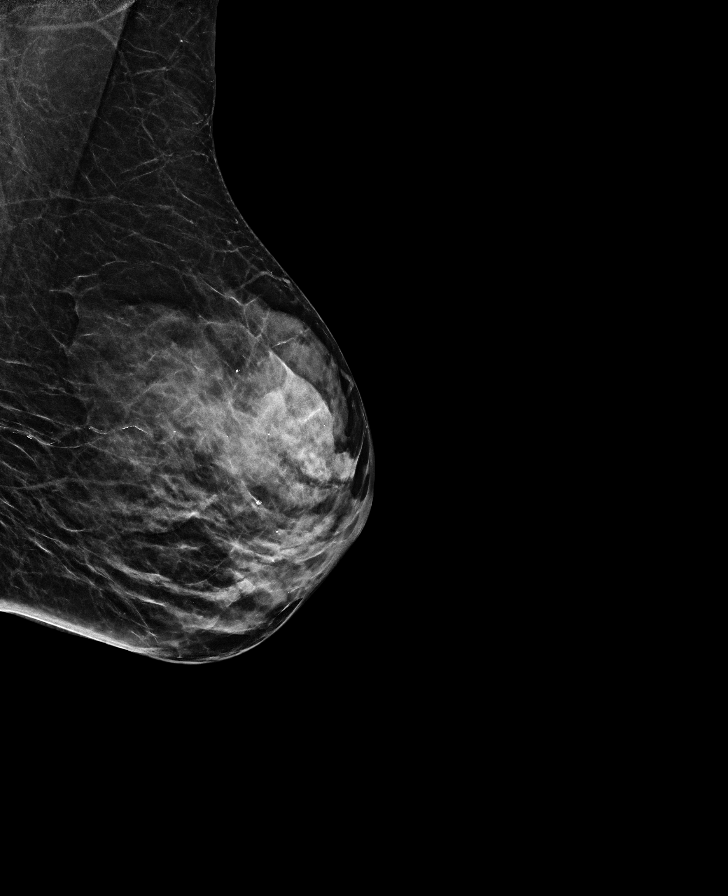

[R MLO]
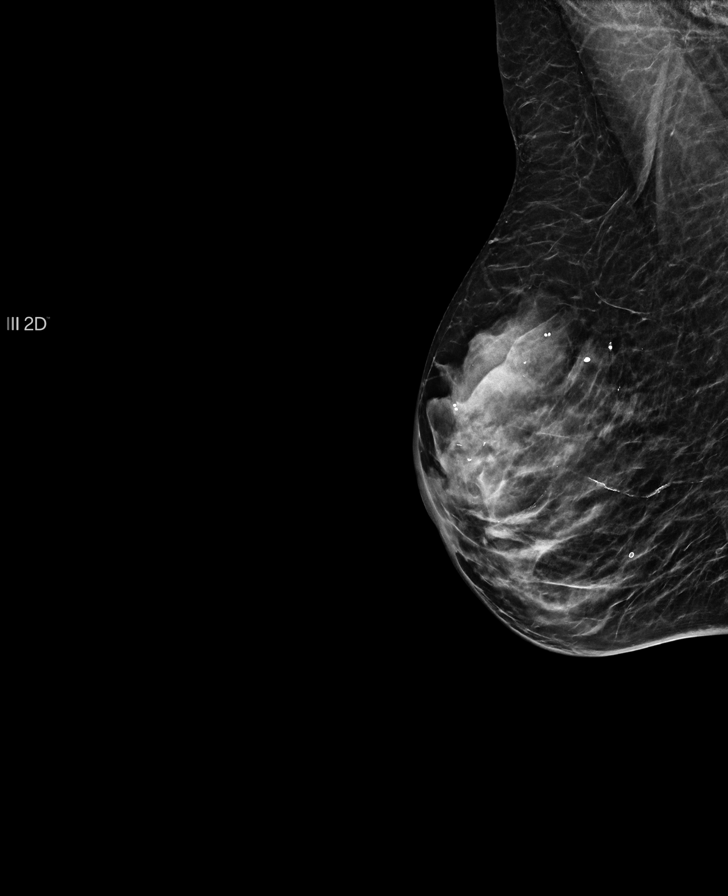

[L MLO tomo · tomo slice 9/16.0]
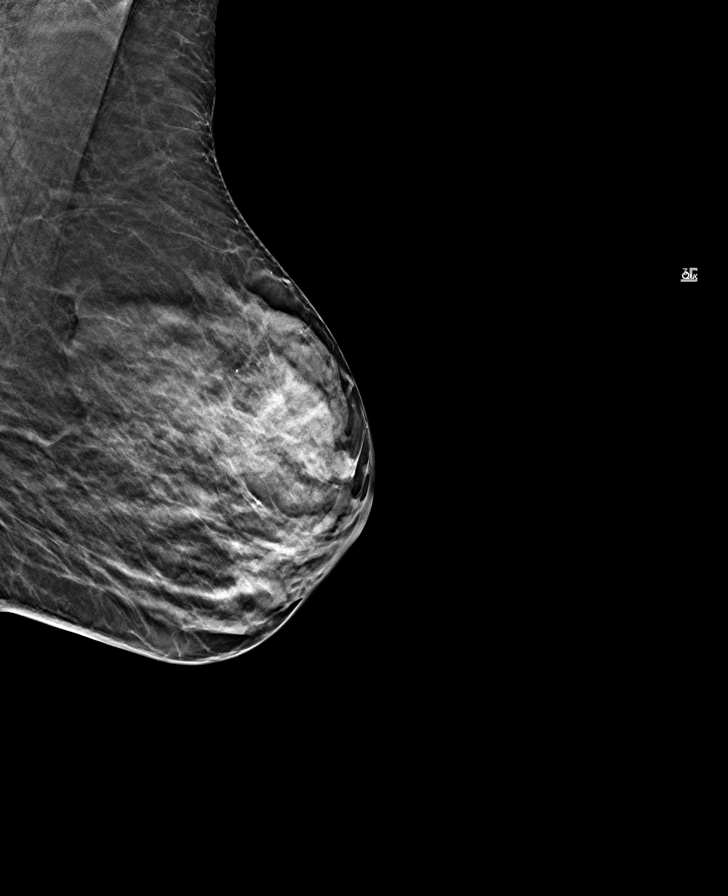

[R MLO tomo · tomo slice 9/18.0]
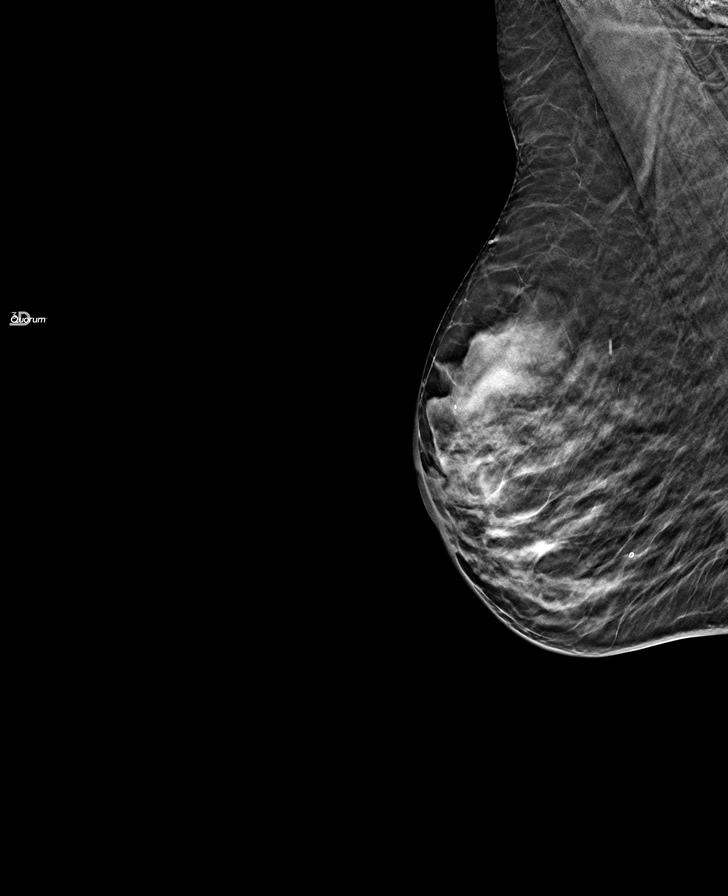

[R CC tomo · tomo slice 9/17.0]
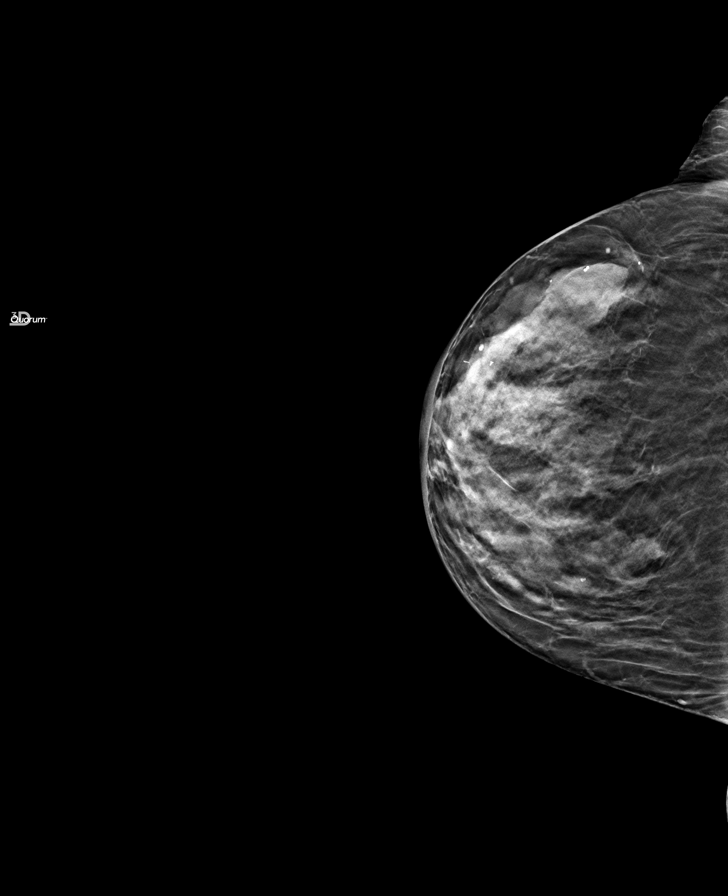

[L CC tomo · tomo slice 9/17.0]
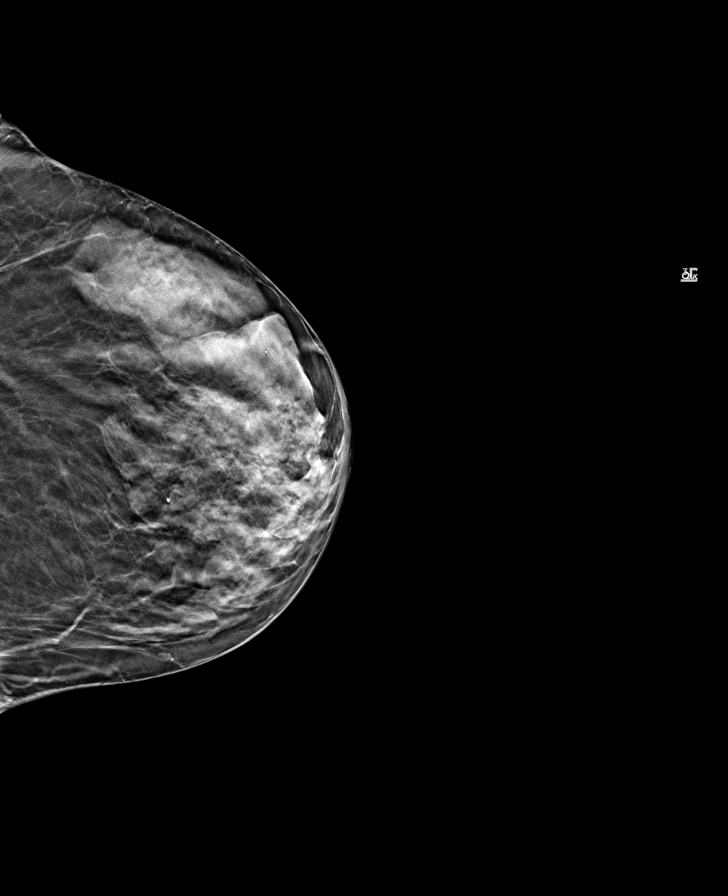

[8 of 24 positions shown; findings below may reference images not displayed]

FINDINGS: Benign calcification. No suspicious mass, calcifications, or area of 
architectural distortion in either breast.
IMPRESSION: Stable mammogram. 
(BI-RADS 2) Benign findings. Routine mammographic follow-up is recommended.
